# Patient Record
Sex: Female | Born: 1965
Health system: Southern US, Community
[De-identification: ages and names within clinical notes are randomized; demographics above are authoritative.]

## PROBLEM LIST (undated history)

## (undated) DIAGNOSIS — K2950 Unspecified chronic gastritis without bleeding: Secondary | ICD-10-CM

## (undated) DIAGNOSIS — F419 Anxiety disorder, unspecified: Secondary | ICD-10-CM

## (undated) DIAGNOSIS — O009 Unspecified ectopic pregnancy without intrauterine pregnancy: Secondary | ICD-10-CM

## (undated) DIAGNOSIS — R0600 Dyspnea, unspecified: Secondary | ICD-10-CM

## (undated) DIAGNOSIS — K219 Gastro-esophageal reflux disease without esophagitis: Secondary | ICD-10-CM

## (undated) DIAGNOSIS — K759 Inflammatory liver disease, unspecified: Secondary | ICD-10-CM

## (undated) DIAGNOSIS — J449 Chronic obstructive pulmonary disease, unspecified: Secondary | ICD-10-CM

## (undated) DIAGNOSIS — J189 Pneumonia, unspecified organism: Secondary | ICD-10-CM

## (undated) DIAGNOSIS — C801 Malignant (primary) neoplasm, unspecified: Secondary | ICD-10-CM

## (undated) DIAGNOSIS — K8020 Calculus of gallbladder without cholecystitis without obstruction: Secondary | ICD-10-CM

## (undated) HISTORY — DX: Anxiety disorder, unspecified: F41.9

## (undated) HISTORY — DX: Calculus of gallbladder without cholecystitis without obstruction: K80.20

## (undated) HISTORY — PX: MOUTH SURGERY: SHX715

## (undated) HISTORY — PX: DIAGNOSTIC LAPAROSCOPY: SUR761

## (undated) HISTORY — PX: LAPAROSCOPIC UNILATERAL SALPINGECTOMY: SHX5934

---

## 2009-07-10 DIAGNOSIS — K219 Gastro-esophageal reflux disease without esophagitis: Secondary | ICD-10-CM | POA: Insufficient documentation

## 2009-07-16 DIAGNOSIS — B1920 Unspecified viral hepatitis C without hepatic coma: Secondary | ICD-10-CM | POA: Insufficient documentation

## 2010-11-12 ENCOUNTER — Emergency Department: Payer: Self-pay | Admitting: Emergency Medicine

## 2011-01-08 ENCOUNTER — Ambulatory Visit: Payer: Self-pay

## 2012-01-29 DIAGNOSIS — K739 Chronic hepatitis, unspecified: Secondary | ICD-10-CM | POA: Insufficient documentation

## 2012-06-23 LAB — HM PAP SMEAR: HM Pap smear: NEGATIVE

## 2012-07-12 LAB — URINALYSIS, COMPLETE
Leukocyte Esterase: NEGATIVE
Protein: NEGATIVE
RBC,UR: 1 /HPF (ref 0–5)
Specific Gravity: 1.011 (ref 1.003–1.030)
Squamous Epithelial: 5
WBC UR: 1 /HPF (ref 0–5)

## 2012-07-12 LAB — COMPREHENSIVE METABOLIC PANEL
BUN: 11 mg/dL (ref 7–18)
Calcium, Total: 8.3 mg/dL — ABNORMAL LOW (ref 8.5–10.1)
Chloride: 103 mmol/L (ref 98–107)
EGFR (African American): >60
EGFR (Non-African Amer.): >60
Glucose: 86 mg/dL (ref 65–99)
Osmolality: 269 (ref 275–301)
Potassium: 3.8 mmol/L (ref 3.5–5.1)
Sodium: 135 mmol/L — ABNORMAL LOW (ref 136–145)
Total Protein: 7.6 g/dL (ref 6.4–8.2)

## 2012-07-12 LAB — CBC WITH DIFFERENTIAL/PLATELET
Basophil #: 0 10*3/uL (ref 0.0–0.1)
Basophil %: 0.5 %
HCT: 44.3 % (ref 35.0–47.0)
HGB: 15.1 g/dL (ref 12.0–16.0)
MCH: 30.5 pg (ref 26.0–34.0)
MCV: 89 fL (ref 80–100)
Monocyte #: 0.5 x10 3/mm (ref 0.2–0.9)
Neutrophil #: 3.2 10*3/uL (ref 1.4–6.5)
Neutrophil %: 61.2 %
Platelet: 149 10*3/uL — ABNORMAL LOW (ref 150–440)
RDW: 12.8 % (ref 11.5–14.5)
WBC: 5.3 10*3/uL (ref 3.6–11.0)

## 2012-07-12 LAB — PRO B NATRIURETIC PEPTIDE: B-Type Natriuretic Peptide: 57 pg/mL (ref 0–125)

## 2012-07-12 LAB — RAPID INFLUENZA A&B ANTIGENS

## 2012-07-13 ENCOUNTER — Inpatient Hospital Stay: Payer: Self-pay | Admitting: Specialist

## 2012-07-14 LAB — BASIC METABOLIC PANEL
Chloride: 111 mmol/L — ABNORMAL HIGH (ref 98–107)
Co2: 26 mmol/L (ref 21–32)
Creatinine: 0.69 mg/dL (ref 0.60–1.30)
EGFR (Non-African Amer.): >60
Glucose: 101 mg/dL — ABNORMAL HIGH (ref 65–99)
Sodium: 143 mmol/L (ref 136–145)

## 2012-07-14 LAB — CBC WITH DIFFERENTIAL/PLATELET
Eosinophil #: 0 10*3/uL (ref 0.0–0.7)
MCH: 30.6 pg (ref 26.0–34.0)
MCHC: 33.8 g/dL (ref 32.0–36.0)
MCV: 91 fL (ref 80–100)
Neutrophil #: 1.9 10*3/uL (ref 1.4–6.5)
Neutrophil %: 36.5 %
RBC: 4.58 10*6/uL (ref 3.80–5.20)
RDW: 12.8 % (ref 11.5–14.5)

## 2012-07-18 LAB — CULTURE, BLOOD (SINGLE)

## 2012-10-08 ENCOUNTER — Ambulatory Visit: Payer: Self-pay

## 2013-05-18 DIAGNOSIS — J4489 Other specified chronic obstructive pulmonary disease: Secondary | ICD-10-CM | POA: Insufficient documentation

## 2013-06-17 ENCOUNTER — Ambulatory Visit: Payer: Self-pay | Admitting: Family Medicine

## 2013-12-16 DIAGNOSIS — Z720 Tobacco use: Secondary | ICD-10-CM | POA: Insufficient documentation

## 2013-12-16 DIAGNOSIS — J449 Chronic obstructive pulmonary disease, unspecified: Secondary | ICD-10-CM | POA: Insufficient documentation

## 2013-12-16 DIAGNOSIS — E669 Obesity, unspecified: Secondary | ICD-10-CM | POA: Insufficient documentation

## 2014-07-11 DIAGNOSIS — J45909 Unspecified asthma, uncomplicated: Secondary | ICD-10-CM | POA: Insufficient documentation

## 2014-09-16 DIAGNOSIS — M545 Low back pain, unspecified: Secondary | ICD-10-CM | POA: Insufficient documentation

## 2014-09-28 ENCOUNTER — Emergency Department
Admit: 2014-09-28 | Disposition: A | Discharge status: Home / Self Care | Payer: Self-pay | Admitting: Emergency Medicine

## 2014-09-29 DIAGNOSIS — M5431 Sciatica, right side: Secondary | ICD-10-CM | POA: Insufficient documentation

## 2014-09-30 NOTE — H&P (Signed)
PATIENT NAME:  Molly Brandt, Molly Brandt MR#:  [id number] DATE OF BIRTH:  May 19, 1966  DATE OF ADMISSION:  07/13/2012  REFERRING PHYSICIAN:  Dr. Cinda Quest.  PRIMARY CARE PHYSICIAN:  [facility]  CHIEF COMPLAINT: Cough, fever, productive of  sputum.   HISTORY OF PRESENT ILLNESS: This is a 49 year old female with significant past medical history of gastroesophageal reflux disease, asthma, who presents with complaint of shortness of breath, cough and fever. The patient reports her symptoms started over last week, where she started to complain of cough that initially was nonproductive and then she started to have fever, the cough became productive with white the color of her sputum, as well was accompanied by earache in the right ear with fevers, chills, headache, back pain and shortness of breath.  As well,  she was feeling muscle aches all over, which prompted her to come to the ED.  In the ED, the patient had fever of 101.6, was hypoxic, saturating 91% on room air. The patient's chest x-ray was done and did not show any conclusive opacity, but did show some diffuse interstitial pneumonitis versus edema. The patient had blood cultures sent and was started on Rocephin and Zithromax. The patient did not have any leukocytosis.  The hospitalist service was requested to admit the patient due to her hypoxia and fever. The patient denies any chest pain, any dysuria, polyuria, coffee-ground emesis, diarrhea, constipation, abdominal pain, loss of consciousness or lightheadedness.   PAST MEDICAL HISTORY: 1.  Hepatitis C.  2.  Gastroesophageal reflux disease.  3.  Asthma.   PAST SURGICAL HISTORY:  1.  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  2.  Tonsillectomy.  3.  Laparoscopic salpingo-oophorectomy for ectopic pregnancy.   ALLERGIES: No known drug allergies.   HOME MEDICATIONS:  Omeprazole 40 mg oral daily.   SOCIAL HISTORY: The patient reports she is smoking less than 1 pack per day. She had decreased  dramatically over the last 2 days. Denies any alcohol or illicit drug use.   FAMILY HISTORY: Denies any family history of diabetes or hypertension.   REVIEW OF SYSTEMS: GENERAL: Complaints of fever, chills, fatigue, generalized weakness and body ache.  EYES: Denies blurry vision, double vision, inflammation, glaucoma.  ENT: Reports right earache. He denies tinnitus, hearing loss, epistaxis discharge, snoring.  RESPIRATORY: Complains of cough with productive sputum, yellow in color and dyspnea. Denies any history of COPD or hemoptysis.  CARDIOVASCULAR: Denies chest pain, edema, arrhythmia, palpitations or syncope.  GASTROINTESTINAL: Denies nausea, vomiting, diarrhea, abdominal pain, hematemesis, rectal bleed.  GENITOURINARY: Denies dysuria, hematuria or renal colic.  ENDOCRINE:  Denies polyuria, polydipsia, heat or cold intolerance.  HEMATOLOGY: Denies anemia, easy bruising or bleeding diathesis.  INTEGUMENTARY: Denies acne, rash or skin lesions.  MUSCULOSKELETAL: Complains of generalized body ache, Denies any redness, cramps, arthritis or gout.  NEUROLOGIC: Denies CVA, TIA, seizures, dementia, weakness, numbness, tremors or vertigo.  PSYCHIATRIC: Denies anxiety, schizophrenia, nervousness, insomnia, substance or alcohol abuse.   PHYSICAL EXAMINATION: VITAL SIGNS: Temperature 99.7, T-max 101.6, pulse 79, respiratory rate 18, blood pressure 110/61, saturating 94% on oxygen.  GENERAL: Well-nourished female who is comfortable in bed in no apparent distress.  HEENT: Head atraumatic, normocephalic. Pupils equal, reactive to light. Pink conjunctivae. Anicteric sclerae. Moist oral mucosa.  NECK: Supple. No thyromegaly. No JVD.  CHEST: Had good air entry bilaterally. No wheezing, rales, rhonchi.  CARDIOVASCULAR: S1, S2 heard. No rubs, murmur or gallops.  ABDOMEN: Soft, nontender, nondistended. Bowel sounds present  EXTREMITIES: No edema. No clubbing.  No cyanosis.   PSYCHIATRIC: Awake, alert x 3  communicative, pleasant. Intact judgment and insight.  NEUROLOGIC: No gross intact. Motor 5/5. No focal deficits.  SKIN: Normal skin turgor. Warm and dry.   PERTINENT LABORATORY AND DIAGNOSTIC DATA:  Glucose 86, BNP 57, BUN 11, creatinine 0.71, sodium 135, potassium 3.8, chloride 103, CO2 23, ALT 62, AST 62, alkaline phosphatase is 88.  White blood cell 5.3, hemoglobin 15.1, hematocrit 44.3, platelets 149. Influenza negative. Urinalysis negative nitrite.  Chest x-ray: Bilateral diffuse interstitial thickening representing interstitial edema versus interstitial pneumonitis secondary to infectious or inflammatory etiology.  No focal parenchymal opacity.   ASSESSMENT AND PLAN: 1.  Fever, productive sputum, shortness of breath. This is most likely due to acute bronchitis as there is no concrete evidence of opacity on the chest x-ray, but as well, it might be too soon for the chest x-ray to show opacity for pneumonia. We will start the patient on intravenous Levaquin, we will follow on the blood cultures and we will repeat the chest x-ray in 24 hours to see if there is any opacity that develops.  We will continue the same treatment as well, for  pneumonia if found on her next chest x-ray.  2.  History of asthma. The patient has no wheezing. We will continue with oxygen as needed and will have her on p.r.n. albuterol.  3.  Tobacco abuse. The patient was counseled and she is requesting nicotine patches.  4.  Deep vein thrombosis prophylaxis. Subcutaneous heparin.  5.  Gastroesophageal reflux disease. Continue with Protonix.   CODE STATUS: Full code.   Total time spent on admission and patient care: 50 minutes.    ____________________________ Albertine Patricia, MD dse:ct D: 07/13/2012 01:48:06 ET T: 07/13/2012 09:34:50 ET JOB#: [id number]  cc: Albertine Patricia, MD, <Dictator> French Kendra Graciela Husbands MD ELECTRONICALLY SIGNED 07/17/2012 5:09

## 2014-09-30 NOTE — Discharge Summary (Signed)
PATIENT NAME:  Molly Brandt, Molly Brandt MR#:  [id number] DATE OF BIRTH:  [birth year]  DATE OF ADMISSION:  07/13/2012 DATE OF DISCHARGE:  07/15/2012  For a detailed note, please take a look at the history and physical on admission by Dr. Waldron Labs.    DIAGNOSES AT DISCHARGE: As follows: Acute bronchitis/pneumonia. Chronic obstructive pulmonary disease not requiring home oxygen. Gastroesophageal reflux disease. Fever of unknown origin, now resolved.   DIET: The patient is being discharged on a regular diet.   ACTIVITY: As tolerated.   FOLLOWUP: At the [facility] in the next 1 to 2 weeks.   DISCHARGE MEDICATIONS: Omeprazole 40 mg daily, prednisone taper starting at 50 mg down to 10 mg over the next 5 days, Levaquin 5 mg daily x7 days, Spiriva 1 puff daily and Advair 250/50 one puff b.i.d.   PERTINENT STUDIES DONE DURING THE HOSPITAL COURSE: As follows: A chest x-ray done on admission showing bilateral diffuse interstitial thickening, likely representing interstitial pneumonia versus interstitial pneumonitis. Blood cultures essentially negative. Influenza A and B antigens negative.   BRIEF HOSPITAL COURSE: This is a 49 year old female with medical problems as mentioned above, presented to the hospital with fever, cough, congestion.  1. Fever of unknown origin. The exact source of the fever is still unclear but suspected to be secondary to acute bronchitis/pneumonia. The patient was admitted to the hospital, started empirically on IV Levaquin. Since then, her fever curve has improved and now resolved. Her chest x-ray showed b/l diffuse interstitial opacities consistent with interstitial edema versus interstitial pneumonitis but no evidence of pneumonia. Her urinalysis was negative. Since she has improved on Levaquin, she is being discharged on p.o. Levaquin over the next 5 days.  2. GERD. The patient was maintained on her Prilosec. She will resume that.  3. COPD with mild COPD exacerbation. She likely had  a mild COPD exacerbation secondary to acute bronchitis and pneumonia. The patient has a long-time history of tobacco abuse. She is not on any inhalers. She was ambulated on room air, desatted to about 81% to 82% on room air. She was strongly advised to quit smoking. I did discharge her on some Advair, Spiriva and prednisone taper and empiric Levaquin as stated. She was also discharged on home oxygen and will have close followup at the [facility] as an outpatient.   The patient was in agreement with this plan.   CODE STATUS: The patient is a full code.   TIME SPENT: 40 minutes.    ____________________________ Belia Heman. Verdell Carmine, MD vjs:gb D: 07/15/2012 16:20:33 ET T: 07/16/2012 03:50:00 ET JOB#: [id number]  cc: Belia Heman. Verdell Carmine, MD, <Dictator> [facility] Henreitta Leber MD ELECTRONICALLY SIGNED 07/22/2012 17:36

## 2014-12-15 DIAGNOSIS — K219 Gastro-esophageal reflux disease without esophagitis: Secondary | ICD-10-CM | POA: Insufficient documentation

## 2015-01-09 ENCOUNTER — Encounter: Payer: Self-pay | Admitting: *Deleted

## 2015-01-10 ENCOUNTER — Ambulatory Visit: Admission: RE | Admit: 2015-01-10 | Payer: Medicaid Other | Source: Ambulatory Visit | Admitting: Gastroenterology

## 2015-01-10 ENCOUNTER — Encounter: Admission: RE | Payer: Self-pay | Source: Ambulatory Visit

## 2015-01-10 HISTORY — DX: Chronic obstructive pulmonary disease, unspecified: J44.9

## 2015-01-10 HISTORY — DX: Inflammatory liver disease, unspecified: K75.9

## 2015-01-10 SURGERY — COLONOSCOPY WITH PROPOFOL
Anesthesia: General

## 2015-02-27 ENCOUNTER — Encounter: Admission: RE | Payer: Self-pay | Source: Ambulatory Visit

## 2015-02-27 ENCOUNTER — Ambulatory Visit: Admission: RE | Admit: 2015-02-27 | Payer: Medicaid Other | Source: Ambulatory Visit | Admitting: Gastroenterology

## 2015-02-27 SURGERY — ESOPHAGOGASTRODUODENOSCOPY (EGD) WITH PROPOFOL
Anesthesia: General

## 2015-04-07 ENCOUNTER — Encounter: Payer: Self-pay | Admitting: *Deleted

## 2015-04-10 ENCOUNTER — Encounter: Payer: Self-pay | Admitting: Anesthesiology

## 2015-04-10 ENCOUNTER — Ambulatory Visit
Admission: RE | Admit: 2015-04-10 | Discharge: 2015-04-10 | Disposition: A | Discharge status: Home / Self Care | Payer: Medicaid Other | Source: Ambulatory Visit | Attending: Gastroenterology | Admitting: Gastroenterology

## 2015-04-10 ENCOUNTER — Encounter
Admission: RE | Disposition: A | Discharge status: Home / Self Care | Payer: Self-pay | Source: Ambulatory Visit | Attending: Gastroenterology

## 2015-04-10 ENCOUNTER — Encounter: Payer: Self-pay | Admitting: *Deleted

## 2015-04-10 DIAGNOSIS — K219 Gastro-esophageal reflux disease without esophagitis: Secondary | ICD-10-CM | POA: Diagnosis not present

## 2015-04-10 DIAGNOSIS — Z9981 Dependence on supplemental oxygen: Secondary | ICD-10-CM | POA: Diagnosis not present

## 2015-04-10 DIAGNOSIS — Z79899 Other long term (current) drug therapy: Secondary | ICD-10-CM | POA: Insufficient documentation

## 2015-04-10 DIAGNOSIS — B1920 Unspecified viral hepatitis C without hepatic coma: Secondary | ICD-10-CM | POA: Insufficient documentation

## 2015-04-10 DIAGNOSIS — K625 Hemorrhage of anus and rectum: Secondary | ICD-10-CM | POA: Insufficient documentation

## 2015-04-10 DIAGNOSIS — J449 Chronic obstructive pulmonary disease, unspecified: Secondary | ICD-10-CM | POA: Insufficient documentation

## 2015-04-10 DIAGNOSIS — Z539 Procedure and treatment not carried out, unspecified reason: Secondary | ICD-10-CM | POA: Diagnosis not present

## 2015-04-10 LAB — PROTIME-INR
INR: 0.85
Prothrombin Time: 11.8 seconds (ref 11.4–15.0)

## 2015-04-10 LAB — PLATELET COUNT: Platelets: 272 10*3/uL (ref 150–440)

## 2015-04-10 SURGERY — COLONOSCOPY WITH PROPOFOL
Anesthesia: General

## 2015-04-10 MED ORDER — SODIUM CHLORIDE 0.9 % IV SOLN
INTRAVENOUS | Status: DC
  Administered 2015-04-10: 16:00:00 via INTRAVENOUS

## 2015-04-10 MED ORDER — SODIUM CHLORIDE 0.9 % IV SOLN: INTRAVENOUS | Status: DC

## 2015-04-10 NOTE — H&P (Addendum)
Outpatient short stay form Pre-procedure 04/10/2015 4:53 PM Lollie Sails MD  Primary Physician: Dr. Hetty Blend  Reason for visit:  EGD and colonoscopy  History of present illness:  Patient is a 49 year old female presenting for EGD and colonoscopy in regards to history of reflux refractive to current GI as well as some rectal bleeding. She's never had a EGD or colonoscopy in the past. She tolerated her prep well. She denies taking any aspirin or blood thinner products. She is however on for medications for her breathing as well as home oxygen.    Current facility-administered medications:  .  0.9 %  sodium chloride infusion, , Intravenous, Continuous, Lollie Sails, MD, Last Rate: 20 mL/hr at 04/10/15 1620 .  0.9 %  sodium chloride infusion, , Intravenous, Continuous, Lollie Sails, MD .  0.9 %  sodium chloride infusion, , Intravenous, Continuous, Lollie Sails, MD  Prescriptions prior to admission  Medication Sig Dispense Refill Last Dose  . albuterol (PROVENTIL HFA;VENTOLIN HFA) 108 (90 BASE) MCG/ACT inhaler Inhale 2 puffs into the lungs every 6 (six) hours as needed for wheezing or shortness of breath.   04/09/2015 at 0600  . Fluticasone-Salmeterol (ADVAIR) 250-50 MCG/DOSE AEPB Inhale 1 puff into the lungs 2 (two) times daily.   04/09/2015 at Unknown time  . mometasone (NASONEX) 50 MCG/ACT nasal spray Place 2 sprays into the nose daily.   04/10/2015 at 0600  . omeprazole (PRILOSEC) 20 MG capsule Take 20 mg by mouth daily.   04/10/2015 at 0600  . tiotropium (SPIRIVA) 18 MCG inhalation capsule Place 18 mcg into inhaler and inhale daily.   Past Week at Unknown time  . cyclobenzaprine (FLEXERIL) 10 MG tablet Take 10 mg by mouth 3 (three) times daily as needed for muscle spasms.   Not Taking at Unknown time     Allergies  Allergen Reactions  . Naproxen   . Tramadol      Past Medical History  Diagnosis Date  . COPD (chronic obstructive pulmonary disease) ([place])    . Hepatitis     Hepatitis C    Review of systems:      Physical Exam    Heart and lungs: Regular rate and rhythm without rub or gallop, lungs are bilaterally clear    HEENT: Normocephalic atraumatic eyes are anicteric    Other:     Pertinant exam for procedure: Soft nontender nondistended bowel sounds positive normoactive    Planned proceedures: EGD and colonoscopy with indicated procedures I have discussed the risks benefits and complications of procedures to include not limited to bleeding, infection, perforation and the risk of sedation and the patient wishes to proceed.    Lollie Sails, MD Gastroenterology 04/10/2015  4:53 PM     Anesthesia was not able to provide services this evening do this case on this ASA 3 patient with chronic lung disease. We'll arrange for her to return tomorrow. Clear liquids overnight and one half bottle of magnesium citrate this evening. Discussed with patient she appreciates the reason for the delay and agrees.

## 2015-04-11 ENCOUNTER — Encounter: Admission: RE | Payer: Self-pay | Source: Ambulatory Visit

## 2015-04-11 ENCOUNTER — Encounter
Admission: RE | Disposition: A | Discharge status: Home / Self Care | Payer: Self-pay | Source: Ambulatory Visit | Attending: Gastroenterology

## 2015-04-11 ENCOUNTER — Ambulatory Visit: Admission: RE | Admit: 2015-04-11 | Payer: Medicaid Other | Source: Ambulatory Visit | Admitting: Gastroenterology

## 2015-04-11 ENCOUNTER — Ambulatory Visit
Admission: RE | Admit: 2015-04-11 | Discharge: 2015-04-11 | Disposition: A | Discharge status: Home / Self Care | Payer: Medicaid Other | Source: Ambulatory Visit | Attending: Gastroenterology | Admitting: Gastroenterology

## 2015-04-11 ENCOUNTER — Encounter: Payer: Self-pay | Admitting: *Deleted

## 2015-04-11 ENCOUNTER — Ambulatory Visit: Payer: Medicaid Other | Admitting: Anesthesiology

## 2015-04-11 DIAGNOSIS — K2950 Unspecified chronic gastritis without bleeding: Secondary | ICD-10-CM

## 2015-04-11 DIAGNOSIS — K2930 Chronic superficial gastritis without bleeding: Secondary | ICD-10-CM | POA: Diagnosis not present

## 2015-04-11 DIAGNOSIS — B1920 Unspecified viral hepatitis C without hepatic coma: Secondary | ICD-10-CM | POA: Insufficient documentation

## 2015-04-11 DIAGNOSIS — K625 Hemorrhage of anus and rectum: Secondary | ICD-10-CM | POA: Diagnosis not present

## 2015-04-11 DIAGNOSIS — Z87891 Personal history of nicotine dependence: Secondary | ICD-10-CM | POA: Diagnosis not present

## 2015-04-11 DIAGNOSIS — Z886 Allergy status to analgesic agent status: Secondary | ICD-10-CM | POA: Diagnosis not present

## 2015-04-11 DIAGNOSIS — K210 Gastro-esophageal reflux disease with esophagitis: Secondary | ICD-10-CM | POA: Diagnosis not present

## 2015-04-11 DIAGNOSIS — J449 Chronic obstructive pulmonary disease, unspecified: Secondary | ICD-10-CM | POA: Diagnosis not present

## 2015-04-11 DIAGNOSIS — K6289 Other specified diseases of anus and rectum: Secondary | ICD-10-CM | POA: Insufficient documentation

## 2015-04-11 DIAGNOSIS — Z885 Allergy status to narcotic agent status: Secondary | ICD-10-CM | POA: Diagnosis not present

## 2015-04-11 DIAGNOSIS — R1011 Right upper quadrant pain: Secondary | ICD-10-CM | POA: Diagnosis present

## 2015-04-11 HISTORY — DX: Unspecified chronic gastritis without bleeding: K29.50

## 2015-04-11 HISTORY — PX: COLONOSCOPY WITH PROPOFOL: SHX5780

## 2015-04-11 HISTORY — PX: ESOPHAGOGASTRODUODENOSCOPY (EGD) WITH PROPOFOL: SHX5813

## 2015-04-11 SURGERY — ESOPHAGOGASTRODUODENOSCOPY (EGD) WITH PROPOFOL
Anesthesia: General

## 2015-04-11 SURGERY — COLONOSCOPY WITH PROPOFOL
Anesthesia: General

## 2015-04-11 MED ORDER — FENTANYL CITRATE (PF) 100 MCG/2ML IJ SOLN
INTRAMUSCULAR | Status: DC | PRN
  Administered 2015-04-11: 50 ug via INTRAVENOUS

## 2015-04-11 MED ORDER — HYDRALAZINE HCL 20 MG/ML IJ SOLN
INTRAMUSCULAR | Status: DC | PRN
  Administered 2015-04-11: 10 mg via INTRAVENOUS

## 2015-04-11 MED ORDER — SODIUM CHLORIDE 0.9 % IV SOLN
INTRAVENOUS | Status: DC
  Administered 2015-04-11: 08:00:00 via INTRAVENOUS

## 2015-04-11 MED ORDER — LIDOCAINE HCL (CARDIAC) 20 MG/ML IV SOLN
INTRAVENOUS | Status: DC | PRN
  Administered 2015-04-11: 60 mg via INTRAVENOUS

## 2015-04-11 MED ORDER — PROPOFOL 500 MG/50ML IV EMUL
INTRAVENOUS | Status: DC | PRN
  Administered 2015-04-11: 140 ug/kg/min via INTRAVENOUS

## 2015-04-11 MED ORDER — MIDAZOLAM HCL 2 MG/2ML IJ SOLN
INTRAMUSCULAR | Status: DC | PRN
  Administered 2015-04-11: 1 mg via INTRAVENOUS

## 2015-04-11 MED ORDER — SODIUM CHLORIDE 0.9 % IV SOLN
INTRAVENOUS | Status: DC
  Administered 2015-04-11: 1000 mL via INTRAVENOUS

## 2015-04-11 MED ORDER — IPRATROPIUM-ALBUTEROL 0.5-2.5 (3) MG/3ML IN SOLN
RESPIRATORY_TRACT | Status: AC
  Administered 2015-04-11: 3 mL
  Filled 2015-04-11: qty 3

## 2015-04-11 NOTE — Anesthesia Preprocedure Evaluation (Signed)
Anesthesia Evaluation  Patient identified by MRN, date of birth, ID band Patient awake    Reviewed: Allergy & Precautions, H&P , NPO status , Patient's Chart, lab work & pertinent test results  History of Anesthesia Complications Negative for: history of anesthetic complications  Airway Mallampati: III  TM Distance: >3 FB Neck ROM: full    Dental  (+) Poor Dentition, Chipped, Missing   Pulmonary neg shortness of breath, COPD, former smoker,    Pulmonary exam normal breath sounds clear to auscultation       Cardiovascular Exercise Tolerance: Good (-) angina(-) Past MI and (-) DOE negative cardio ROS Normal cardiovascular exam Rhythm:regular Rate:Normal     Neuro/Psych negative neurological ROS  negative psych ROS   GI/Hepatic negative GI ROS, (+) Hepatitis -, C  Endo/Other  negative endocrine ROS  Renal/GU negative Renal ROS  negative genitourinary   Musculoskeletal   Abdominal   Peds  Hematology negative hematology ROS (+)   Anesthesia Other Findings Past Medical History:   COPD (chronic obstructive pulmonary disease) (*              Hepatitis                                                      Comment:Hepatitis C  Past Surgical History:   LAPAROSCOPIC UNILATERAL SALPINGECTOMY           Left              DIAGNOSTIC LAPAROSCOPY                                       BMI    Body Mass Index   34.74 kg/m 2      Reproductive/Obstetrics negative OB ROS                             Anesthesia Physical Anesthesia Plan  ASA: III  Anesthesia Plan: General   Post-op Pain Management:    Induction:   Airway Management Planned:   Additional Equipment:   Intra-op Plan:   Post-operative Plan:   Informed Consent: I have reviewed the patients History and Physical, chart, labs and discussed the procedure including the risks, benefits and alternatives for the proposed anesthesia with  the patient or authorized representative who has indicated his/her understanding and acceptance.   Dental Advisory Given  Plan Discussed with: Anesthesiologist, CRNA and Surgeon  Anesthesia Plan Comments:         Anesthesia Quick Evaluation

## 2015-04-11 NOTE — Op Note (Signed)
[facility] Gastroenterology Patient Name: Molly Brandt Procedure Date: 04/11/2015 8:12 AM MRN: [id number] Account #: [id number] Date of Birth: [birth year] Admit Type: Outpatient Age: 49 Room: [facility] ENDO ROOM 3 Gender: Female Note Status: Finalized Procedure:         Upper GI endoscopy Indications:       Abdominal pain in the right upper quadrant, Dyspepsia Providers:         Lollie Sails, MD Referring MD:      No Local Md, MD (Referring MD) Medicines:         Monitored Anesthesia Care Complications:     No immediate complications. Procedure:         Pre-Anesthesia Assessment:                    - ASA Grade Assessment: III - A patient with severe                     systemic disease.                    After obtaining informed consent, the endoscope was passed                     under direct vision. Throughout the procedure, the                     patient's blood pressure, pulse, and oxygen saturations                     were monitored continuously. The Endoscope was introduced                     through the mouth, and advanced to the third part of                     duodenum. The upper GI endoscopy was accomplished without                     difficulty. The patient tolerated the procedure well. Findings:      The Z-line was irregular. Biopsies were taken with a cold forceps for       histology.      The exam of the esophagus was otherwise normal.      Diffuse and patchy mild inflammation characterized by erythema was found       in the gastric body. Biopsies were taken with a cold forceps for       histology.      Patchy mild inflammation characterized by erosions and erythema was       found in the gastric antrum. Biopsies were taken with a cold forceps for       histology. Biopsies were taken with a cold forceps for Helicobacter       pylori testing.      The cardia and gastric fundus were normal on retroflexion.      The examined duodenum was  normal. Impression:        - Z-line irregular. Biopsied.                    - Gastritis. Biopsied.                    - Erosive gastritis. Biopsied.                    -  Normal examined duodenum. Recommendation:    - Use Prilosec (omeprazole) 40 mg PO daily daily.                    - Await pathology results.                    - Return to GI clinic in 1 month.                    - for continued sylmptoms,. recommend evaluation for                     biliay dyskinesia and gallbladder disease Procedure Code(s): --- Professional ---                    [phone], Esophagogastroduodenoscopy, flexible, transoral;                     with biopsy, single or multiple Diagnosis Code(s): --- Professional ---                    530.89, Other specified disorders of esophagus                    535.50, Unspecified gastritis and gastroduodenitis,                     without mention of hemorrhage                    535.40, Other specified gastritis, without mention of                     hemorrhage                    789.01, Abdominal pain, right upper quadrant                    536.8, Dyspepsia and other specified disorders of function                     of stomach CPT copyright 2014 American Medical Association. All rights reserved. The codes documented in this report are preliminary and upon coder review may  be revised to meet current compliance requirements. Lollie Sails, MD 04/11/2015 8:28:47 AM This report has been signed electronically. Number of Addenda: 0 Note Initiated On: 04/11/2015 8:12 AM      [facility]

## 2015-04-11 NOTE — H&P (Addendum)
Outpatient short stay form Pre-procedure 04/11/2015 8:03 AM Lollie Sails MD  Primary Physician: Dr. Hetty Blend  Reason for visit:  EGD and colonoscopy  History of present illness:  Patient is a 49 year old female presenting today with complaint of this can acid reflux not responsive to omeprazole 20 mg daily. He also has right upper quadrant pain. This does not seem to radiate anywhere. It is associated with episodes of nausea and vomiting. She has not had a gallbladder surgery in the past. She is returning from yesterday when we were unable to do the procedure due to loss of anesthesia coverage.      Current facility-administered medications:  .  0.9 %  sodium chloride infusion, , Intravenous, Continuous, Lollie Sails, MD  Prescriptions prior to admission  Medication Sig Dispense Refill Last Dose  . albuterol (PROVENTIL HFA;VENTOLIN HFA) 108 (90 BASE) MCG/ACT inhaler Inhale 2 puffs into the lungs every 6 (six) hours as needed for wheezing or shortness of breath.   04/10/2015 at Unknown time  . cyclobenzaprine (FLEXERIL) 10 MG tablet Take 10 mg by mouth 3 (three) times daily as needed for muscle spasms.   04/10/2015 at Unknown time  . Fluticasone-Salmeterol (ADVAIR) 250-50 MCG/DOSE AEPB Inhale 1 puff into the lungs 2 (two) times daily.   04/10/2015 at Unknown time  . mometasone (NASONEX) 50 MCG/ACT nasal spray Place 2 sprays into the nose daily.   04/10/2015 at Unknown time  . omeprazole (PRILOSEC) 20 MG capsule Take 20 mg by mouth daily.   04/10/2015 at Unknown time  . tiotropium (SPIRIVA) 18 MCG inhalation capsule Place 18 mcg into inhaler and inhale daily.   04/10/2015 at Unknown time     Allergies  Allergen Reactions  . Naproxen   . Tramadol      Past Medical History  Diagnosis Date  . COPD (chronic obstructive pulmonary disease) ([place])   . Hepatitis     Hepatitis C    Review of systems:      Physical Exam    Heart and lungs: Regular rate and rhythm  without rub or gallop, lungs are bilaterally clear    HEENT: Normocephalic atraumatic eyes are anicteric    Other:     Pertinant exam for procedure: Soft nontender nondistended bowel sounds positive normoactive    Planned proceedures: EGD and colonoscopy with indicated procedures I have discussed the risks benefits and complications of procedures to include not limited to bleeding, infection, perforation and the risk of sedation and the patient wishes to proceed.    Lollie Sails, MD Gastroenterology 04/11/2015  8:03 AM     I did a rectal examination this morning she stated she did not have any further bowel movements overnight. This had some semisoft material. We will proceed with the upper scope. We will check the rectum with the scope and see if the prep is adequate if not she will need to be rescheduled. This was discussed with the patient.

## 2015-04-11 NOTE — Anesthesia Postprocedure Evaluation (Signed)
  Anesthesia Post-op Note  Patient: Molly Brandt  Procedure(s) Performed: Procedure(s): COLONOSCOPY WITH PROPOFOL (N/A) ESOPHAGOGASTRODUODENOSCOPY (EGD) WITH PROPOFOL (N/A)  Anesthesia type:General  Patient location: PACU  Post pain: Pain level controlled  Post assessment: Post-op Vital signs reviewed, Patient's Cardiovascular Status Stable, Respiratory Function Stable, Patent Airway and No signs of Nausea or vomiting  Post vital signs: Reviewed and stable  Last Vitals:  Filed Vitals:   04/11/15 0909  BP: 134/98  Pulse: 85  Temp:   Resp: 17    Level of consciousness: awake, alert  and patient cooperative  Complications: No apparent anesthesia complications

## 2015-04-11 NOTE — Anesthesia Procedure Notes (Signed)
Performed by: COOK-MARTIN, Houa Ackert Pre-anesthesia Checklist: Patient identified, Emergency Drugs available, Suction available, Patient being monitored and Timeout performed Patient Re-evaluated:Patient Re-evaluated prior to inductionOxygen Delivery Method: Nasal cannula Preoxygenation: Pre-oxygenation with 100% oxygen Intubation Type: IV induction Airway Equipment and Method: Bite block Placement Confirmation: positive ETCO2 and CO2 detector     

## 2015-04-11 NOTE — Op Note (Signed)
[facility] Gastroenterology Patient Name: Molly Brandt Procedure Date: 04/11/2015 8:12 AM MRN: [id number] Account #: [id number] Date of Birth: February 19, 1966 Admit Type: Outpatient Age: 49 Room: [facility] ENDO ROOM 3 Gender: Female Note Status: Finalized Procedure:         Colonoscopy Indications:       Rectal bleeding, Rectal pain Providers:         Lollie Sails, MD Referring MD:      No Local Md, MD (Referring MD) Medicines:         Monitored Anesthesia Care Complications:     No immediate complications. Procedure:         Pre-Anesthesia Assessment:                    - ASA Grade Assessment: III - A patient with severe                     systemic disease.                    After obtaining informed consent, the colonoscope was                     passed under direct vision. Throughout the procedure, the                     patient's blood pressure, pulse, and oxygen saturations                     were monitored continuously. The procedure was aborted.                     The colonscope was not inserted. Medications were given. Findings:      When patient was turned, she was noted to have stool leakage, this being       some solid as well as liquid stool. Scope not inserted. Impression:        - No specimens collected. Recommendation:    - For future colonoscopy the patient will require an                     extended preparation. If there are any questions, please                     contact the gastroenterologist. Diagnosis Code(s): --- Professional ---                    569.3, Hemorrhage of rectum and anus                    569.42, Anal or rectal pain Lollie Sails, MD 04/11/2015 8:33:20 AM This report has been signed electronically. Number of Addenda: 0 Note Initiated On: 04/11/2015 8:12 AM      [facility]

## 2015-04-11 NOTE — Transfer of Care (Signed)
Immediate Anesthesia Transfer of Care Note  Patient: Molly Brandt  Procedure(s) Performed: Procedure(s): COLONOSCOPY WITH PROPOFOL (N/A) ESOPHAGOGASTRODUODENOSCOPY (EGD) WITH PROPOFOL (N/A)  Patient Location: PACU  Anesthesia Type:General  Level of Consciousness: awake and sedated  Airway & Oxygen Therapy: Patient connected to face mask oxygen  Post-op Assessment: Report given to RN and Post -op Vital signs reviewed and stable  Post vital signs: Reviewed and stable  Last Vitals:  Filed Vitals:   04/11/15 0746  BP: 172/111  Pulse: 73  Temp: 36.7 C  Resp: 18    Complications: No apparent anesthesia complications

## 2015-04-12 ENCOUNTER — Encounter: Payer: Self-pay | Admitting: Gastroenterology

## 2015-04-12 LAB — SURGICAL PATHOLOGY

## 2015-06-01 ENCOUNTER — Other Ambulatory Visit: Payer: Self-pay | Admitting: Gastroenterology

## 2015-06-01 DIAGNOSIS — R1084 Generalized abdominal pain: Secondary | ICD-10-CM

## 2015-06-19 ENCOUNTER — Ambulatory Visit: Admission: RE | Admit: 2015-06-19 | Payer: Medicaid Other | Source: Ambulatory Visit

## 2015-06-23 ENCOUNTER — Ambulatory Visit: Payer: Medicaid Other

## 2015-06-23 ENCOUNTER — Ambulatory Visit: Admission: RE | Admit: 2015-06-23 | Payer: Medicaid Other | Source: Ambulatory Visit

## 2015-07-05 ENCOUNTER — Encounter: Admission: RE | Admit: 2015-07-05 | Payer: Medicaid Other | Source: Ambulatory Visit

## 2015-07-05 ENCOUNTER — Ambulatory Visit: Payer: Medicaid Other

## 2015-07-20 ENCOUNTER — Ambulatory Visit
Admission: RE | Admit: 2015-07-20 | Discharge: 2015-07-20 | Disposition: A | Discharge status: Home / Self Care | Payer: Medicaid Other | Source: Ambulatory Visit | Attending: Physical Medicine and Rehabilitation | Admitting: Physical Medicine and Rehabilitation

## 2015-07-20 ENCOUNTER — Other Ambulatory Visit: Payer: Self-pay | Admitting: Physical Medicine and Rehabilitation

## 2015-07-20 DIAGNOSIS — M5136 Other intervertebral disc degeneration, lumbar region: Secondary | ICD-10-CM

## 2015-07-20 DIAGNOSIS — M47816 Spondylosis without myelopathy or radiculopathy, lumbar region: Secondary | ICD-10-CM | POA: Insufficient documentation

## 2015-07-28 ENCOUNTER — Encounter: Payer: Self-pay | Admitting: *Deleted

## 2015-07-31 ENCOUNTER — Encounter: Admission: RE | Payer: Self-pay | Source: Ambulatory Visit

## 2015-07-31 ENCOUNTER — Ambulatory Visit: Admission: RE | Admit: 2015-07-31 | Payer: Medicaid Other | Source: Ambulatory Visit | Admitting: Gastroenterology

## 2015-07-31 HISTORY — DX: Unspecified chronic gastritis without bleeding: K29.50

## 2015-07-31 HISTORY — DX: Unspecified ectopic pregnancy without intrauterine pregnancy: O00.90

## 2015-07-31 SURGERY — COLONOSCOPY WITH PROPOFOL
Anesthesia: General

## 2015-08-03 ENCOUNTER — Ambulatory Visit: Admission: RE | Admit: 2015-08-03 | Payer: Medicaid Other | Source: Ambulatory Visit

## 2015-08-03 ENCOUNTER — Encounter: Admission: RE | Admit: 2015-08-03 | Payer: Medicaid Other | Source: Ambulatory Visit

## 2015-12-01 ENCOUNTER — Other Ambulatory Visit: Payer: Self-pay | Admitting: Thoracic Surgery

## 2015-12-01 DIAGNOSIS — J449 Chronic obstructive pulmonary disease, unspecified: Secondary | ICD-10-CM

## 2016-01-04 ENCOUNTER — Ambulatory Visit: Payer: Medicaid Other | Attending: Thoracic Surgery

## 2016-01-25 ENCOUNTER — Ambulatory Visit: Payer: Disability Insurance | Attending: Thoracic Surgery

## 2016-01-25 DIAGNOSIS — J449 Chronic obstructive pulmonary disease, unspecified: Secondary | ICD-10-CM | POA: Diagnosis not present

## 2016-01-25 MED ORDER — ALBUTEROL SULFATE (2.5 MG/3ML) 0.083% IN NEBU
2.5000 mg | INHALATION_SOLUTION | Freq: Once | RESPIRATORY_TRACT | Status: AC
  Administered 2016-01-25: 2.5 mg via RESPIRATORY_TRACT
  Filled 2016-01-25: qty 3

## 2016-08-07 DIAGNOSIS — Z62810 Personal history of physical and sexual abuse in childhood: Secondary | ICD-10-CM | POA: Insufficient documentation

## 2016-08-07 DIAGNOSIS — Q369 Cleft lip, unilateral: Secondary | ICD-10-CM | POA: Insufficient documentation

## 2016-08-07 DIAGNOSIS — F1911 Other psychoactive substance abuse, in remission: Secondary | ICD-10-CM | POA: Insufficient documentation

## 2017-02-20 DIAGNOSIS — R9431 Abnormal electrocardiogram [ECG] [EKG]: Secondary | ICD-10-CM | POA: Insufficient documentation

## 2017-03-05 ENCOUNTER — Other Ambulatory Visit: Payer: Self-pay | Admitting: Cardiology

## 2017-03-05 DIAGNOSIS — R0789 Other chest pain: Secondary | ICD-10-CM

## 2017-03-31 ENCOUNTER — Encounter: Admission: RE | Admit: 2017-03-31 | Payer: Medicaid Other | Source: Ambulatory Visit

## 2017-04-07 ENCOUNTER — Other Ambulatory Visit: Payer: Disability Insurance

## 2017-04-09 ENCOUNTER — Ambulatory Visit: Payer: Disability Insurance

## 2017-06-10 DIAGNOSIS — C801 Malignant (primary) neoplasm, unspecified: Secondary | ICD-10-CM

## 2017-06-10 HISTORY — DX: Malignant (primary) neoplasm, unspecified: C80.1

## 2017-08-11 DIAGNOSIS — Z85810 Personal history of malignant neoplasm of tongue: Secondary | ICD-10-CM | POA: Insufficient documentation

## 2017-08-26 ENCOUNTER — Encounter
Admission: RE | Admit: 2017-08-26 | Discharge: 2017-08-26 | Disposition: A | Discharge status: Home / Self Care | Payer: Medicaid Other | Source: Ambulatory Visit | Attending: Otolaryngology | Admitting: Otolaryngology

## 2017-08-26 ENCOUNTER — Other Ambulatory Visit: Payer: Self-pay

## 2017-08-26 HISTORY — DX: Dyspnea, unspecified: R06.00

## 2017-08-26 HISTORY — DX: Gastro-esophageal reflux disease without esophagitis: K21.9

## 2017-08-26 HISTORY — DX: Pneumonia, unspecified organism: J18.9

## 2017-08-26 HISTORY — DX: Malignant (primary) neoplasm, unspecified: C80.1

## 2017-08-26 NOTE — Pre-Procedure Instructions (Signed)
Progress Notes - in this encounter  Sydnee Levans, MD - 03/05/2017 3:30 PM EDT Formatting of this note may be different from the original.   Chief Complaint: Chief Complaint  Patient presents with  . Establish Care  Molly Brandt abnormal ekg  Date of Service: 03/05/2017 Date of Birth: [birth year] PCP: Melonie Florida, MD  History of Present Illness: Molly Brandt is a 52 y.o.female patient who presents in referral for evaluation after she was noted to have an abnormal EKG. She is also preoperative for a oral surgical procedure. She has occasional chest discomfort. She denies orthopnea or PND. She does have chronic shortness of breath but is on bronchodilators and chronic oxygen therapy. Electrocardiogram reveals sinus rhythm with a short PR and inverted T waves in the lateral leads. She denies syncope or presyncope. She is not very active. She has a tobacco history but states that that she has discontinued this habit.  Past Medical and Surgical History  Past Medical History Past Medical History:  Diagnosis Date  . Chronic gastritis 04/11/2015  . COPD (chronic obstructive pulmonary disease) , unspecified (CMS-HCC)  . Hepatitis C  . History of ectopic pregnancy  . Irregular Z line of esophagus 04/11/2015   Past Surgical History She has a past surgical history that includes Laparoscopic salpingectomy (Left, 07/31/2002); egd (04/11/2015); and Colonoscopy (04/11/2015).   Medications and Allergies  Current Medications  Current Outpatient Prescriptions  Medication Sig Dispense Refill  . albuterol (PROVENTIL) 2.5 mg /3 mL (0.083 %) nebulizer solution Take 3 mLs (2.5 mg total) by nebulization every 6 (six) hours as needed for Wheezing. 75 mL 12  . albuterol (VENTOLIN HFA) 90 mcg/actuation inhaler VENTOLIN HFA 108 (90 Base) MCG/ACT AERS  . cyclobenzaprine (FLEXERIL) 10 MG tablet 10 mg.  . fluticasone-salmeterol (ADVAIR DISKUS) 500-50 mcg/dose diskus inhaler ADVAIR DISKUS 500-50  MCG/DOSE AEPB  . mometasone (NASONEX) 50 mcg/actuation nasal spray NASONEX 50 MCG/ACT SUSP  . omeprazole (PRILOSEC) 40 MG DR capsule TAKE 1 CAPSULE (40 MG TOTAL) BY MOUTH ONCE DAILY. TAKE ONE TABLET 30-45 MINUTES BEFORE BREAKFAST 30 capsule 0  . PARoxetine (PAXIL) 20 MG tablet Take 1 tablet (20 mg total) by mouth once daily. (Patient not taking: Reported on 12/31/2016 ) 30 tablet 0  . tiotropium (SPIRIVA WITH HANDIHALER) 18 mcg inhalation capsule 18 mcg.  Marland Kitchen XIIDRA 5 % ophthalmic solution INSTILL 1 DROP IN BOTH EYES TWICE A DAY FOR 28 DAYS 1   No current facility-administered medications for this visit.   Allergies: Naproxen and Tramadol  Social and Family History  Social History reports that she has quit smoking. Her smoking use included Cigarettes. She has a 25.00 pack-year smoking history. She has never used smokeless tobacco. She reports that she does not drink alcohol or use drugs.  Family History Family History  Problem Relation Age of Onset  . Asthma Mother  . High blood pressure (Hypertension) Father  . Cancer Father  . Colon cancer Neg Hx  . Colon polyps Neg Hx  . Liver disease Neg Hx  . Rectal cancer Neg Hx  . Ulcers Neg Hx   Review of Systems  Review of Systems  Constitutional: Negative for chills, diaphoresis, fever, malaise/fatigue and weight loss.  HENT: Negative for congestion, ear discharge, hearing loss and tinnitus.  Eyes: Negative for blurred vision.  Respiratory: Positive for shortness of breath. Negative for cough, hemoptysis, sputum production and wheezing.  Cardiovascular: Positive for chest pain. Negative for palpitations, orthopnea, claudication, leg swelling and PND.  Gastrointestinal: Negative  for abdominal pain, blood in stool, constipation, diarrhea, heartburn, melena, nausea and vomiting.  Genitourinary: Negative for dysuria, frequency, hematuria and urgency.  Musculoskeletal: Negative for back pain, falls, joint pain and myalgias.  Skin: Negative for  itching and rash.  Neurological: Negative for dizziness, tingling, focal weakness, loss of consciousness, weakness and headaches.  Endo/Heme/Allergies: Negative for polydipsia. Does not bruise/bleed easily.  Psychiatric/Behavioral: Negative for depression, memory loss and substance abuse. The patient is not nervous/anxious.   Physical Examination   Vitals:BP 140/60  Pulse 72  Resp 12  Ht 157.5 cm (5\' 2" )  Wt 93 kg (205 lb)  BMI 37.49 kg/m  Ht:157.5 cm (5\' 2" ) Wt:93 kg (205 lb) SAY:TKZS surface area is 2.02 meters squared. Body mass index is 37.49 kg/m.  Wt Readings from Last 3 Encounters:  03/05/17 93 kg (205 lb)  12/31/16 92.4 kg (203 lb 12.8 oz)  09/23/16 93.9 kg (207 lb)   BP Readings from Last 3 Encounters:  03/05/17 140/60  12/31/16 127/84  09/23/16 120/83   General appearance appears in no acute distress  Head Mouth and Eye exam Normocephalic, without obvious abnormality, atraumatic Dentition is good Eyes appear anicteric   Neck exam Thyroid: normal  Nodes: no obvious adenopathy  LUNGS Breath Sounds: Normal Percussion: Normal  CARDIOVASCULAR JVP CV wave: no HJR: no Elevation at 90 degrees: None Carotid Pulse: normal pulsation bilaterally Bruit: None Apex: apical impulse normal  Auscultation Rhythm: normal sinus rhythm S1: normal S2: normal Clicks: no Rub: no Murmurs: no murmurs  Gallop: None ABDOMEN Liver enlargement: no Pulsatile aorta: no Ascites: no Bruits: no  EXTREMITIES Clubbing: no Edema: 1+ bilateral pedal edema Pulses: peripheral pulses symmetrical Femoral Bruits: no Amputation: no SKIN Rash: no Cyanosis: no Embolic phemonenon: no Bruising: no NEURO Alert and Oriented to person, place and time: yes Non focal: yes  PSYCH: Pt appears to have normal affect  LABS REVIEWED Last 3 CBC results: Lab Results  Component Value Date  WBC 7.2 06/01/2015  WBC 9.3 12/15/2014   Lab Results  Component Value Date  HGB 14.8  06/01/2015  HGB 14.2 12/15/2014   Lab Results  Component Value Date  HCT 45.2 06/01/2015  HCT 43.6 12/15/2014   Lab Results  Component Value Date  PLT 321 06/01/2015  PLT 314 12/15/2014   Lab Results  Component Value Date  CREATININE 0.8 06/01/2015  BUN 8 06/01/2015  NA 139 06/01/2015  K 4.1 06/01/2015  CL 102 06/01/2015  CO2 30.1 06/01/2015   Lab Results  Component Value Date  ALT 35 06/01/2015  AST 36 06/01/2015  ALKPHOS 75 06/01/2015   No results found for: TSH  Diagnostic Studies Reviewed:  EKG EKG demonstrated normal sinus rhythm, nonspecific ST and T waves changes.  Assessment and Plan   52 y.o. female with  ICD-10-CM ICD-9-CM  1. Atypical chest pain-etiology of chest pain is unclear we will proceed with Lexiscan sestamibi due to the inability ambulate to guide risk stratification prior to elective surgery as well as to evaluate the etiology of chest pain. Further recommendations after this is complete. R07.89 786.59 NM myocardial perfusion SPECT multiple (stress and rest)  ECG stress test only  2. Abnormal EKG-nonspecific changes. As per above will evaluate for ischemic etiology R94.31 794.31  3. COPD, moderate , unspecified (CMS-HCC)-continue to refrain from tobacco. Continue with bronchodilators. J44.9 496   Return if symptoms worsen or fail to improve.  These notes generated with voice recognition software. I apologize for typographical errors.  [place],  MD       Plan of Treatment - as of this encounter  Upcoming Encounters Upcoming Encounters  Date Type Specialty Care Team Description  04/08/2017 Office Visit Pulmonology Erby Pian, MD  [facility]  [place], [place] [address]  [phone]  [phone] (Fax)     Scheduled Tests Scheduled Tests  Name Priority Associated Diagnoses Order Schedule  NM myocardial perfusion SPECT multiple (stress and rest) Routine Atypical  chest pain  Expected: 03/12/2017, Expires: 03/05/2018  ECG stress test only Routine Atypical chest pain  1 Occurrences starting 03/05/2017 until 03/05/2018   Procedures - in this encounter  Procedure Name Priority Date/Time Associated Diagnosis Comments  ECG  02/20/2017 12:00 AM EDT  Results for this procedure are in the results section.    ECG Results - in this encounter   ECG ECG  Narrative Performed At  This result has an attachment that is not available.  Ordered by an unspecified provider.        Visit Diagnoses   Diagnosis  Atypical chest pain - Primary  Other chest pain   Abnormal EKG  Nonspecific abnormal electrocardiogram (ECG) (EKG)   COPD, moderate , unspecified (CMS-HCC)   Images Document Information  Primary Care Provider Other Service Providers Document Coverage Dates  Melonie Florida MD (Jun. 09, 2015 - Present) [phone] (Work) [phone] (Fax) [place], [place] [address]   Sep. 26, 2018   [facility] [address] [place], [place] [address]   Encounter Providers Encounter Date  Sydnee Levans MD (Attending) [phone] (Work) [phone] (Fax) [place],  [address]  Sep. 26, 2018

## 2017-08-26 NOTE — Pre-Procedure Instructions (Signed)
Progress Notes - documented in this encounter  Table of Contents for Progress Notes  Erby Pian, MD - 07/28/2017 3:30 PM EST  Marja Kays, RT - 07/28/2017 3:30 PM EST  Erby Pian, MD - 07/28/2017 3:30 PM EST    Erby Pian, MD - 07/28/2017 3:30 PM EST Formatting of this note might be different from the original.  COPD  History of Present Illness: Molly Brandt is a 52 y.o. female presents to clinic for copd, on oxygen, chronic dyspnea, no worse. 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 See last notes.   Current Medications:  Current Outpatient Medications  Medication Sig Dispense Refill  . albuterol (PROVENTIL) 2.5 mg /3 mL (0.083 %) nebulizer solution Take 3 mLs (2.5 mg total) by nebulization every 6 (six) hours as needed for Wheezing. 75 mL 12  . albuterol (VENTOLIN HFA) 90 mcg/actuation inhaler VENTOLIN HFA 108 (90 Base) MCG/ACT AERS  . fluticasone-salmeterol (ADVAIR DISKUS) 500-50 mcg/dose diskus inhaler ADVAIR DISKUS 500-50 MCG/DOSE AEPB  . mometasone (NASONEX) 50 mcg/actuation nasal spray NASONEX 50 MCG/ACT SUSP  . omeprazole (PRILOSEC) 40 MG DR capsule TAKE 1 CAPSULE (40 MG TOTAL) BY MOUTH ONCE DAILY. TAKE ONE TABLET 30-45 MINUTES BEFORE BREAKFAST 30 capsule 0  . PARoxetine (PAXIL) 20 MG tablet Take 1 tablet (20 mg total) by mouth once daily. 30 tablet 0  . tiotropium (SPIRIVA WITH HANDIHALER) 18 mcg inhalation capsule 18 mcg.  Marland Kitchen XIIDRA 5 % ophthalmic solution INSTILL 1 DROP IN BOTH EYES TWICE A DAY FOR 28 DAYS 1  . cyclobenzaprine (FLEXERIL) 10 MG tablet 10 mg.   No current facility-administered medications for this visit.   Problem List:  Patient Active Problem List  Diagnosis  . COPD, moderate , unspecified (CMS-HCC)  . Tobacco abuse  . Obesity, unspecified  . Gastroesophageal reflux disease  . Abnormal EKG   History: Past Medical History:  Diagnosis Date  . Chronic gastritis  04/11/2015  . COPD (chronic obstructive pulmonary disease) , unspecified (CMS-HCC)  . Hepatitis C  . History of ectopic pregnancy  . Irregular Z line of esophagus 04/11/2015   Past Surgical History:  Procedure Laterality Date  . COLONOSCOPY 04/11/2015  Patient not cleaned out. Scope not inserted./Reschedule at next available/MUS  . EGD 04/11/2015  Chronic gastritis/GERD/No Repeat/MUS  . Laparoscopic salpingectomy Left 07/31/2002  Pathology showing partially decidualized endometrium without trophoblastic tissue; fallopian tube showing intramural trophoblastic tissue and chorionic villi in blood clot, consistent with ectopic   Family History  Problem Relation Age of Onset  . Asthma Mother  . High blood pressure (Hypertension) Father  . Cancer Father  . Colon cancer Neg Hx  . Colon polyps Neg Hx  . Liver disease Neg Hx  . Rectal cancer Neg Hx  . Ulcers Neg Hx   Social History   Socioeconomic History  . Marital status: Single  Spouse name: Not on file  . Number of children: Not on file  . Years of education: Not on file  . Highest education level: Not on file  Occupational History  . Not on file  Social Needs  . Financial resource strain: Not on file  . Food insecurity:  Worry: Not on file  Inability: Not on file  . Transportation needs:  Medical: Not on file  Non-medical: Not on file  Tobacco Use  . Smoking status: Current Some Day Smoker  Packs/day: 1.00  Years: 39.00  Pack years:  39.00  Types: Cigarettes  . Smokeless tobacco: Never Used  Substance and Sexual Activity  . Alcohol use: No  . Drug use: No  . Sexual activity: Defer  Lifestyle  . Physical activity:  Days per week: Not on file  Minutes per session: Not on file  . Stress: Not on file  Relationships  . Social connections:  Talks on phone: Not on file  Gets together: Not on file  Attends religious service: Not on file  Active member of club or organization: Not on file  Attends meetings of clubs  or organizations: Not on file  Relationship status: Not on file  . Intimate partner violence:  Fear of current or ex partner: Not on file  Emotionally abused: Not on file  Physically abused: Not on file  Forced sexual activity: Not on file  Other Topics Concern  . Not on file  Social History Narrative  . Not on file   Allergies:  Naproxen and Tramadol  Review of Systems: As per above. Pretty much unchanged. No other associated cardiopulmonary, GI, GU, dermatological symptoms today. No focal neurological symptoms or psychological changes.   Physical Exam: BP 118/66  Pulse 78  Temp 36.8 C (98.2 F) (Oral)  Ht 157.5 cm (5\' 2" )  Wt 90.7 kg (200 lb)  SpO2 98% Comment: on 2.5 LPM [place]  BMI 36.58 kg/m 90.7 kg (200 lb) 98% on 2.5 liters General: NAD. Able to speak in complete sentences without cough or dyspnea, [place] 02 is on, over weight HEENT: Normocephalic, nontraumatic. Extraocular movements intact NECK: Supple. No JVD, nodes, thyromegaly CV: RRR no murmurs, gallops, rubs PULM: Normal respiratory effort, Clear  EXTREMITIES: No significant edema, cyanosis or Homans'signs SKIN: Fair turgor. No rashes LYMPHATIC: No nodes NEURO: No gross deficits, no change PSYCH: Appropriate affect, alert,oriented   SPIROMETRY: FVC was 2.21 liters, 77% of predicted FEV1 was 1.65, 70% of predicted FEV1 ratio was 75 FEF 25-75% liters per second was 53% of predicted  LUNG VOLUMES: TLC was 83% of predicted RV was 95% of predicted  DIFFUSION CAPACITY: DLCO was 48% of predicted DLCO/VA was 108% of predicted  FLOW VOLUME LOOP: Expiratory flow volume loop is delayed  Impression Mild-moderate obstruction on spirometry Lung volumes are in normal range DLCO is severely decreased  *Compared to Previous Study, numbers are consistent, if not slightly improved.  Impression: Copd, on oxygen.chronic dyspnea no worse. Over weight. No recent flares, frontal sinus is non painfull and not full as  last visit, s/p levaquin.  Continue advair 500, albuterol, spiriva Increase the the oxygen to 3 liters with activity Weight loss Follow up in 4 months.     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 female reports that she has quit smoking. Her smoking use included cigarettes. She has a 25.00 pack-year smoking history. She has never used smokeless tobacco.  SPIROMETRY: FVC was 2.21 liters, 77% of predicted FEV1 was 1.65, 70% of predicted FEV1 ratio was 75 FEF 25-75% liters per second was 53% of predicted  LUNG VOLUMES: TLC was 83% of predicted RV was 95% of predicted  DIFFUSION CAPACITY: DLCO was 48% of predicted DLCO/VA was 108% of predicted  FLOW VOLUME LOOP: Expiratory flow  volume loop is delayed  Impression Mild-moderate obstruction on spirometry Lung volumes are in normal range DLCO is severely decreased  *Compared to Previous Study, numbers are consistent, if not slightly improved.   Electronically signed by Erby Pian, MD at 07/28/2017 4:39 PM EST  Back to top of Progress Notes Erby Pian, MD - 07/28/2017 3:30 PM EST    Electronically signed by Erby Pian, MD at 07/28/2017 4:39 PM EST  Back to top of Progress Notes   Plan of Treatment - documented as of this encounter  Upcoming Encounters Upcoming Encounters  Date Type Specialty Care Team Description  08/28/2017 Initial consult Otolaryngology Feliz Beam, MD  [facility] [place], [place] [address]  [phone]  [phone] (Fax)    10/23/2017 Office Visit Pulmonology Erby Pian, MD  [facility]  [place], [place] [address]  [phone]  [phone] (Fax)     Scheduled Tests Scheduled Tests  Name Priority Associated Diagnoses Order Schedule  Carbon monoxide diffusing capacity (DLCO) Routine SOB (shortness of breath)  Ordered: 07/28/2017  Flow Volume Loop Routine SOB (shortness of breath)  Ordered: 07/28/2017  Lung Volumes Routine SOB (shortness of breath)  Ordered: 07/28/2017   Procedures - documented in this encounter  Procedure Name Priority Date/Time Associated Diagnosis Comments  PULMONARY FUNCTION TEST  07/28/2017 12:00 AM EST  Results for this procedure are in the results section.    Miscellaneous Results - documented in this encounter   PULMONARY FUNCTION TEST (07/28/2017 12:00 AM EST) PULMONARY FUNCTION TEST (07/28/2017 12:00 AM EST)  Narrative Performed At  This result has an attachment that is not available.  Ordered by an unspecified provider.        Visit Diagnoses - documented in this encounter  Diagnosis  SOB (shortness of breath) - Primary  Shortness of breath   COPD, moderate , unspecified (CMS-HCC)   On supplemental oxygen therapy  Dependence on supplemental oxygen   Overweight    Images Document Information  Primary Care Provider Other Service Providers Document Coverage Dates  Melonie Florida MD (Jun. 09, 2015 - Present) [phone] (Work) [phone] (Fax) [place], [place] [address]   Feb. 18, 2019   [facility] [address] [place], [place] [address]   Encounter Providers Encounter Date  Erby Pian MD (Attending) [phone] (Work) [phone] (Fax) [place],  [address]  Feb. 18, 2019

## 2017-08-26 NOTE — Patient Instructions (Addendum)
Your procedure is scheduled on: 09-03-17 [facility] Report to Same Day Surgery 2nd floor medical mall [facility] Entrance-take elevator on left to 2nd floor.  Check in with surgery information desk.) To find out your arrival time please call [phone] between 1PM - 3PM on 09-02-17 TUESDAY  Remember: Instructions that are not followed completely may result in serious medical risk, up to and including death, or upon the discretion of your surgeon and anesthesiologist your surgery may need to be rescheduled.    _x___ 1. Do not eat food after midnight the night before your procedure. NO GUM OR CANDY AFTER MIDNIGHT.  You may drink clear liquids up to 2 hours before you are scheduled to arrive at the hospital for your procedure.  Do not drink clear liquids within 2 hours of your scheduled arrival to the hospital.  Clear liquids include  --Water or Apple juice without pulp  --Clear carbohydrate beverage such as ClearFast or Gatorade  --Black Coffee or Clear Tea (No milk, no creamers, do not add anything to the coffee or Tea    __x__ 2. No Alcohol for 24 hours before or after surgery.   __x__3. No Smoking or e-cigarettes for 24 prior to surgery.  Do not use any chewable tobacco products for at least 6 hour prior to surgery   ____  4. Bring all medications with you on the day of surgery if instructed.    __x__ 5. Notify your doctor if there is any change in your medical condition     (cold, fever, infections).    x___6. On the morning of surgery brush your teeth with toothpaste and water.  You may rinse your mouth with mouth wash if you wish.  Do not swallow any toothpaste or mouthwash.   Do not wear jewelry, make-up, hairpins, clips or nail polish.  Do not wear lotions, powders, or perfumes. You may wear deodorant.  Do not shave 48 hours prior to surgery. Men may shave face and neck.  Do not bring valuables to the hospital.    [facility] is not responsible for any belongings or  valuables.               Contacts, dentures or bridgework may not be worn into surgery.  Leave your suitcase in the car. After surgery it may be brought to your room.  For patients admitted to the hospital, discharge time is determined by your treatment team.  _  Patients discharged the day of surgery will not be allowed to drive home.  You will need someone to drive you home and stay with you the night of your procedure.    Please read over the following fact sheets that you were given:   [facility] Preparing for Surgery and or MRSA Information   _x___ TAKE THE FOLLOWING MEDICATION THE MORNING OF SURGERY WITH A SMALL SIP OF WATER. These include:  1. PRILOSEC (OMEPRAZOLE)  2.  3.  4.  5.  6.  ____Fleets enema or Magnesium Citrate as directed.   ____ Use CHG Soap or sage wipes as directed on instruction sheet   _X___ Use inhalers on the day of surgery and bring to hospital day of [place].  BRING YOUR ALBUTEROL [place]  ____ Stop Metformin and Janumet 2 days prior to surgery.    ____ Take 1/2 of usual insulin dose the night before surgery and none on the morning  surgery.   _x___ Follow recommendations from Cardiologist, Pulmonologist or PCP regarding stopping Aspirin, Coumadin, Plavix ,Eliquis, Effient, or Pradaxa, and Pletal-STOP ASPIRIN NOW  X____Stop Anti-inflammatories such as Advil, Aleve, Ibuprofen, Motrin, Naproxen, Naprosyn, Goodies powders or aspirin products NOW-OK to take Tylenol    ____ Stop supplements until after surgery.     ____ Bring C-Pap to the hospital.

## 2017-08-27 ENCOUNTER — Encounter
Admission: RE | Admit: 2017-08-27 | Discharge: 2017-08-27 | Disposition: A | Discharge status: Home / Self Care | Payer: Medicaid Other | Source: Ambulatory Visit | Attending: Otolaryngology | Admitting: Otolaryngology

## 2017-08-27 ENCOUNTER — Ambulatory Visit
Admission: RE | Admit: 2017-08-27 | Discharge: 2017-08-27 | Disposition: A | Discharge status: Home / Self Care | Payer: Medicaid Other | Source: Ambulatory Visit | Attending: Otolaryngology | Admitting: Otolaryngology

## 2017-08-27 DIAGNOSIS — J449 Chronic obstructive pulmonary disease, unspecified: Secondary | ICD-10-CM | POA: Diagnosis present

## 2017-08-27 DIAGNOSIS — C801 Malignant (primary) neoplasm, unspecified: Secondary | ICD-10-CM | POA: Insufficient documentation

## 2017-08-27 DIAGNOSIS — R918 Other nonspecific abnormal finding of lung field: Secondary | ICD-10-CM | POA: Insufficient documentation

## 2017-08-27 DIAGNOSIS — R9431 Abnormal electrocardiogram [ECG] [EKG]: Secondary | ICD-10-CM | POA: Insufficient documentation

## 2017-08-27 DIAGNOSIS — Z01818 Encounter for other preprocedural examination: Secondary | ICD-10-CM | POA: Insufficient documentation

## 2017-08-27 LAB — COMPREHENSIVE METABOLIC PANEL
ALT: 42 U/L (ref 14–54)
ANION GAP: 8 (ref 5–15)
AST: 42 U/L — ABNORMAL HIGH (ref 15–41)
Albumin: 3.9 g/dL (ref 3.5–5.0)
Alkaline Phosphatase: 84 U/L (ref 38–126)
BUN: 11 mg/dL (ref 6–20)
CHLORIDE: 103 mmol/L (ref 101–111)
CO2: 28 mmol/L (ref 22–32)
Calcium: 9 mg/dL (ref 8.9–10.3)
Creatinine, Ser: 0.58 mg/dL (ref 0.44–1.00)
Glucose, Bld: 106 mg/dL — ABNORMAL HIGH (ref 65–99)
POTASSIUM: 3.8 mmol/L (ref 3.5–5.1)
Sodium: 139 mmol/L (ref 135–145)
Total Bilirubin: 0.5 mg/dL (ref 0.3–1.2)
Total Protein: 7.8 g/dL (ref 6.5–8.1)

## 2017-08-27 LAB — CBC
HCT: 44.9 % (ref 35.0–47.0)
Hemoglobin: 14.9 g/dL (ref 12.0–16.0)
MCH: 29.2 pg (ref 26.0–34.0)
MCHC: 33.2 g/dL (ref 32.0–36.0)
MCV: 87.8 fL (ref 80.0–100.0)
PLATELETS: 255 10*3/uL (ref 150–440)
RBC: 5.11 MIL/uL (ref 3.80–5.20)
RDW: 13.5 % (ref 11.5–14.5)
WBC: 8.8 10*3/uL (ref 3.6–11.0)

## 2017-08-29 ENCOUNTER — Other Ambulatory Visit: Payer: Self-pay | Admitting: Cardiology

## 2017-08-29 DIAGNOSIS — R9431 Abnormal electrocardiogram [ECG] [EKG]: Secondary | ICD-10-CM

## 2017-08-29 NOTE — Pre-Procedure Instructions (Signed)
CXR sent to anesthesia for review.  Also asked to review "read" EKG.

## 2017-09-01 ENCOUNTER — Encounter
Admission: RE | Admit: 2017-09-01 | Discharge: 2017-09-01 | Disposition: A | Discharge status: Home / Self Care | Payer: Medicaid Other | Source: Ambulatory Visit | Attending: Cardiology | Admitting: Cardiology

## 2017-09-01 DIAGNOSIS — R9431 Abnormal electrocardiogram [ECG] [EKG]: Secondary | ICD-10-CM

## 2017-09-01 LAB — NM MYOCAR MULTI W/SPECT W/WALL MOTION / EF
CHL CUP RESTING HR STRESS: 64 {beats}/min
CSEPEDS: 0 s
CSEPEW: 1 METS
CSEPHR: 56 %
Exercise duration (min): 1 min
LV dias vol: 75 mL (ref 46–106)
LV sys vol: 33 mL
MPHR: 166 {beats}/min
Peak HR: 93 {beats}/min
SDS: 0
SRS: 2
SSS: 0
TID: 1.09

## 2017-09-01 MED ORDER — TECHNETIUM TC 99M TETROFOSMIN IV KIT
12.9200 | PACK | Freq: Once | INTRAVENOUS | Status: AC | PRN
  Administered 2017-09-01: 12.92 via INTRAVENOUS

## 2017-09-01 MED ORDER — REGADENOSON 0.4 MG/5ML IV SOLN
0.4000 mg | Freq: Once | INTRAVENOUS | Status: AC
  Administered 2017-09-01: 0.4 mg via INTRAVENOUS
  Filled 2017-09-01: qty 5

## 2017-09-01 MED ORDER — TECHNETIUM TC 99M TETROFOSMIN IV KIT
30.9500 | PACK | Freq: Once | INTRAVENOUS | Status: AC | PRN
  Administered 2017-09-01: 30.95 via INTRAVENOUS

## 2017-09-01 NOTE — Pre-Procedure Instructions (Signed)
AS INSTRUCTED BY DR IDPOEUM CXR CALLED AND FAXED TO DR Raul Del. SPOKE WITH ASHLEY. LM FOR BECKY AT DR Reola Mosher

## 2017-09-02 NOTE — Pre-Procedure Instructions (Signed)
CLEARED AS MEDIUM RISK BY DR Raul Del

## 2017-09-03 ENCOUNTER — Ambulatory Visit: Payer: Medicaid Other | Admitting: Certified Registered"

## 2017-09-03 ENCOUNTER — Ambulatory Visit
Admission: RE | Admit: 2017-09-03 | Discharge: 2017-09-03 | Disposition: A | Discharge status: Home / Self Care | Payer: Medicaid Other | Source: Ambulatory Visit | Attending: Otolaryngology | Admitting: Otolaryngology

## 2017-09-03 ENCOUNTER — Ambulatory Visit: Payer: Medicaid Other | Admitting: Anesthesiology

## 2017-09-03 ENCOUNTER — Other Ambulatory Visit: Payer: Self-pay

## 2017-09-03 ENCOUNTER — Encounter: Payer: Self-pay | Admitting: *Deleted

## 2017-09-03 ENCOUNTER — Encounter
Admission: RE | Disposition: A | Discharge status: Home / Self Care | Payer: Self-pay | Source: Ambulatory Visit | Attending: Otolaryngology

## 2017-09-03 DIAGNOSIS — B1920 Unspecified viral hepatitis C without hepatic coma: Secondary | ICD-10-CM | POA: Insufficient documentation

## 2017-09-03 DIAGNOSIS — J449 Chronic obstructive pulmonary disease, unspecified: Secondary | ICD-10-CM | POA: Diagnosis not present

## 2017-09-03 DIAGNOSIS — J383 Other diseases of vocal cords: Secondary | ICD-10-CM | POA: Diagnosis present

## 2017-09-03 DIAGNOSIS — F17200 Nicotine dependence, unspecified, uncomplicated: Secondary | ICD-10-CM | POA: Diagnosis not present

## 2017-09-03 DIAGNOSIS — K219 Gastro-esophageal reflux disease without esophagitis: Secondary | ICD-10-CM | POA: Diagnosis not present

## 2017-09-03 DIAGNOSIS — Z79899 Other long term (current) drug therapy: Secondary | ICD-10-CM | POA: Diagnosis not present

## 2017-09-03 DIAGNOSIS — C029 Malignant neoplasm of tongue, unspecified: Secondary | ICD-10-CM | POA: Insufficient documentation

## 2017-09-03 HISTORY — PX: EXCISION OF TONGUE LESION: SHX6434

## 2017-09-03 HISTORY — PX: DIRECT LARYNGOSCOPY: SHX5326

## 2017-09-03 SURGERY — LARYNGOSCOPY, DIRECT
Anesthesia: General | Laterality: Right | Wound class: Clean Contaminated

## 2017-09-03 MED ORDER — REMIFENTANIL HCL 1 MG IV SOLR
INTRAVENOUS | Status: AC
  Filled 2017-09-03: qty 1000

## 2017-09-03 MED ORDER — SODIUM CHLORIDE 0.9 % IV SOLN
INTRAVENOUS | Status: DC | PRN
  Administered 2017-09-03: 20 ug/min via INTRAVENOUS

## 2017-09-03 MED ORDER — LIDOCAINE-EPINEPHRINE 1 %-1:100000 IJ SOLN
INTRAMUSCULAR | Status: AC
  Filled 2017-09-03: qty 1

## 2017-09-03 MED ORDER — FENTANYL CITRATE (PF) 100 MCG/2ML IJ SOLN
INTRAMUSCULAR | Status: DC | PRN
  Administered 2017-09-03: 50 ug via INTRAVENOUS

## 2017-09-03 MED ORDER — FENTANYL CITRATE (PF) 100 MCG/2ML IJ SOLN
INTRAMUSCULAR | Status: AC
  Filled 2017-09-03: qty 2

## 2017-09-03 MED ORDER — DEXAMETHASONE SODIUM PHOSPHATE 10 MG/ML IJ SOLN
INTRAMUSCULAR | Status: DC | PRN
  Administered 2017-09-03: 8 mg via INTRAVENOUS

## 2017-09-03 MED ORDER — SUGAMMADEX SODIUM 200 MG/2ML IV SOLN
INTRAVENOUS | Status: AC
  Filled 2017-09-03: qty 2

## 2017-09-03 MED ORDER — SUCCINYLCHOLINE CHLORIDE 20 MG/ML IJ SOLN
INTRAMUSCULAR | Status: AC
  Filled 2017-09-03: qty 1

## 2017-09-03 MED ORDER — SUCCINYLCHOLINE CHLORIDE 20 MG/ML IJ SOLN
INTRAMUSCULAR | Status: DC | PRN
  Administered 2017-09-03: 100 mg via INTRAVENOUS

## 2017-09-03 MED ORDER — GLYCOPYRROLATE 0.2 MG/ML IJ SOLN
INTRAMUSCULAR | Status: DC | PRN
  Administered 2017-09-03: 0.2 mg via INTRAVENOUS

## 2017-09-03 MED ORDER — LIDOCAINE-EPINEPHRINE (PF) 1 %-1:200000 IJ SOLN
INTRAMUSCULAR | Status: AC
  Filled 2017-09-03: qty 30

## 2017-09-03 MED ORDER — MIDAZOLAM HCL 2 MG/2ML IJ SOLN
INTRAMUSCULAR | Status: AC
  Filled 2017-09-03: qty 2

## 2017-09-03 MED ORDER — ROCURONIUM BROMIDE 50 MG/5ML IV SOLN
INTRAVENOUS | Status: AC
  Filled 2017-09-03: qty 1

## 2017-09-03 MED ORDER — EPHEDRINE SULFATE 50 MG/ML IJ SOLN
INTRAMUSCULAR | Status: DC | PRN
  Administered 2017-09-03: 10 mg via INTRAVENOUS
  Administered 2017-09-03 (×2): 5 mg via INTRAVENOUS

## 2017-09-03 MED ORDER — DEXAMETHASONE SODIUM PHOSPHATE 10 MG/ML IJ SOLN
INTRAMUSCULAR | Status: AC
  Filled 2017-09-03: qty 1

## 2017-09-03 MED ORDER — LACTATED RINGERS IV SOLN
INTRAVENOUS | Status: DC
  Administered 2017-09-03: 07:00:00 via INTRAVENOUS

## 2017-09-03 MED ORDER — LIDOCAINE HCL (PF) 2 % IJ SOLN
INTRAMUSCULAR | Status: AC
  Filled 2017-09-03: qty 10

## 2017-09-03 MED ORDER — PROPOFOL 10 MG/ML IV BOLUS
INTRAVENOUS | Status: AC
  Filled 2017-09-03: qty 20

## 2017-09-03 MED ORDER — OXYMETAZOLINE HCL 0.05 % NA SOLN
NASAL | Status: AC
  Filled 2017-09-03: qty 15

## 2017-09-03 MED ORDER — GLYCOPYRROLATE 0.2 MG/ML IJ SOLN
INTRAMUSCULAR | Status: AC
  Filled 2017-09-03: qty 1

## 2017-09-03 MED ORDER — AMOXICILLIN 400 MG/5ML PO SUSR: ORAL | 0 refills | Status: DC

## 2017-09-03 MED ORDER — LIDOCAINE HCL (CARDIAC) 20 MG/ML IV SOLN
INTRAVENOUS | Status: DC | PRN
  Administered 2017-09-03: 80 mg via INTRAVENOUS

## 2017-09-03 MED ORDER — PROPOFOL 10 MG/ML IV BOLUS
INTRAVENOUS | Status: DC | PRN
  Administered 2017-09-03: 160 mg via INTRAVENOUS

## 2017-09-03 MED ORDER — ROCURONIUM BROMIDE 100 MG/10ML IV SOLN
INTRAVENOUS | Status: DC | PRN
  Administered 2017-09-03: 5 mg via INTRAVENOUS
  Administered 2017-09-03: 35 mg via INTRAVENOUS

## 2017-09-03 MED ORDER — ONDANSETRON HCL 4 MG/2ML IJ SOLN
INTRAMUSCULAR | Status: DC | PRN
  Administered 2017-09-03: 4 mg via INTRAVENOUS

## 2017-09-03 MED ORDER — REMIFENTANIL HCL 1 MG IV SOLR
INTRAVENOUS | Status: DC | PRN
  Administered 2017-09-03: .15 ug/kg/min via INTRAVENOUS

## 2017-09-03 MED ORDER — HYDROCODONE-ACETAMINOPHEN 7.5-325 MG/15ML PO SOLN: ORAL | 0 refills | Status: DC

## 2017-09-03 MED ORDER — PHENYLEPHRINE HCL 10 MG/ML IJ SOLN
INTRAMUSCULAR | Status: DC | PRN
  Administered 2017-09-03 (×4): 100 ug via INTRAVENOUS

## 2017-09-03 MED ORDER — ONDANSETRON HCL 4 MG/2ML IJ SOLN: 4.0000 mg | Freq: Once | INTRAMUSCULAR | Status: DC | PRN

## 2017-09-03 MED ORDER — FENTANYL CITRATE (PF) 100 MCG/2ML IJ SOLN: 25.0000 ug | INTRAMUSCULAR | Status: DC | PRN

## 2017-09-03 MED ORDER — MIDAZOLAM HCL 2 MG/2ML IJ SOLN
INTRAMUSCULAR | Status: DC | PRN
  Administered 2017-09-03 (×2): 1 mg via INTRAVENOUS

## 2017-09-03 MED ORDER — LIDOCAINE-EPINEPHRINE (PF) 1 %-1:200000 IJ SOLN
INTRAMUSCULAR | Status: DC | PRN
  Administered 2017-09-03: 3 mL

## 2017-09-03 MED ORDER — SUGAMMADEX SODIUM 200 MG/2ML IV SOLN
INTRAVENOUS | Status: DC | PRN
  Administered 2017-09-03: 375 mg via INTRAVENOUS

## 2017-09-03 MED ORDER — ALBUTEROL SULFATE HFA 108 (90 BASE) MCG/ACT IN AERS
INHALATION_SPRAY | RESPIRATORY_TRACT | Status: DC | PRN
  Administered 2017-09-03: 4 via RESPIRATORY_TRACT

## 2017-09-03 MED ORDER — ONDANSETRON HCL 4 MG/2ML IJ SOLN
INTRAMUSCULAR | Status: AC
  Filled 2017-09-03: qty 2

## 2017-09-03 MED ORDER — PHENYLEPHRINE HCL 10 MG/ML IJ SOLN
INTRAMUSCULAR | Status: AC
  Filled 2017-09-03: qty 1

## 2017-09-03 MED ORDER — HYDROCODONE-ACETAMINOPHEN 7.5-325 MG/15ML PO SOLN
ORAL | Status: AC
  Administered 2017-09-03: 15 mL
  Filled 2017-09-03: qty 15

## 2017-09-03 SURGICAL SUPPLY — 23 items
BLADE SURG 15 STRL LF DISP TIS (BLADE) ×2 IMPLANT
BLADE SURG 15 STRL SS (BLADE) ×2
CANISTER SUCT 1200ML W/VALVE (MISCELLANEOUS) ×4 IMPLANT
CUP MEDICINE 2OZ PLAST GRAD ST (MISCELLANEOUS) ×4 IMPLANT
DRSG TELFA 4X3 1S NADH ST (GAUZE/BANDAGES/DRESSINGS) ×4 IMPLANT
GLOVE BIO SURGEON STRL SZ7.5 (GLOVE) ×4 IMPLANT
GOWN STRL REUS W/ TWL LRG LVL3 (GOWN DISPOSABLE) ×4 IMPLANT
GOWN STRL REUS W/TWL LRG LVL3 (GOWN DISPOSABLE) ×4
IV SET EXTENSION MINI BORE EPI (IV SETS) ×4 IMPLANT
LABEL OR SOLS (LABEL) ×4 IMPLANT
NDL ENDOSCOPIC URO 20G (NEEDLE) ×4 IMPLANT
NEEDLE FILTER BLUNT 18X 1/2SAF (NEEDLE) ×2
NEEDLE FILTER BLUNT 18X1 1/2 (NEEDLE) ×2 IMPLANT
NS IRRIG 500ML POUR BTL (IV SOLUTION) ×4 IMPLANT
PACK HEAD/NECK (MISCELLANEOUS) ×4 IMPLANT
PATTIES SURGICAL .5 X.5 (GAUZE/BANDAGES/DRESSINGS) ×4 IMPLANT
SOL ANTI-FOG 6CC FOG-OUT (MISCELLANEOUS) ×2 IMPLANT
SOL FOG-OUT ANTI-FOG 6CC (MISCELLANEOUS) ×2
SUT SILK 2 0 SH (SUTURE) ×4 IMPLANT
SUT VIC AB 4-0 RB1 18 (SUTURE) IMPLANT
SUT VIC AB 4-0 RB1 27 (SUTURE) ×2
SUT VIC AB 4-0 RB1 27X BRD (SUTURE) ×2 IMPLANT
SUT VICRYL+ 3-0 27IN RB-1 (SUTURE) IMPLANT

## 2017-09-03 NOTE — Anesthesia Procedure Notes (Signed)
Procedure Name: Intubation Performed by: Lance Muss, CRNA Pre-anesthesia Checklist: Patient identified, Patient being monitored, Timeout performed, Emergency Drugs available and Suction available Patient Re-evaluated:Patient Re-evaluated prior to induction Oxygen Delivery Method: Circle system utilized Preoxygenation: Pre-oxygenation with 100% oxygen Induction Type: IV induction Ventilation: Mask ventilation without difficulty Laryngoscope Size: Mac and 3 Grade View: Grade I Tube type: Oral Tube size: 6.0 mm Number of attempts: 1 Airway Equipment and Method: Stylet Placement Confirmation: ETT inserted through vocal cords under direct vision,  positive ETCO2 and breath sounds checked- equal and bilateral Tube secured with: Tape Dental Injury: Teeth and Oropharynx as per pre-operative assessment

## 2017-09-03 NOTE — H&P (Signed)
History and physical reviewed and will be scanned in later. No change in medical status reported by the patient or family, appears stable for surgery. Has had surgical clearance from cardiology and has seen Dr. Raul Del recently, who manages her pulmonary problems and felt they were stable. Lungs today are CTA, and she denies any new illness.All questions regarding the procedure answered, and patient (or family if a child) expressed understanding of the procedure.  Molly Brandt @TODAY @

## 2017-09-03 NOTE — Discharge Instructions (Signed)

## 2017-09-03 NOTE — Transfer of Care (Signed)
Immediate Anesthesia Transfer of Care Note  Patient: Molly Brandt  Procedure(s) Performed: DIRECT LARYNGOSCOPY WITH EXCISION OF VOCAL CORD LESION (N/A ) EXCISION OF TONGUE LESION (Right )  Patient Location: PACU  Anesthesia Type:General  Level of Consciousness: awake and responds to stimulation  Airway & Oxygen Therapy: Patient Spontanous Breathing and Patient connected to face mask oxygen  Post-op Assessment: Report given to RN and Post -op Vital signs reviewed and stable  Post vital signs: Reviewed and stable  Last Vitals:  Vitals Value Taken Time  BP 115/70 09/03/2017  8:50 AM  Temp    Pulse 92 09/03/2017  8:50 AM  Resp 22 09/03/2017  8:50 AM  SpO2 100 % 09/03/2017  8:50 AM  Vitals shown include unvalidated device data.  Last Pain:  Vitals:   09/03/17 0610  TempSrc: Tympanic  PainSc: 0-No pain         Complications: No apparent anesthesia complications

## 2017-09-03 NOTE — Anesthesia Preprocedure Evaluation (Signed)
Anesthesia Evaluation  Patient identified by MRN, date of birth, ID band Patient awake    Reviewed: Allergy & Precautions, H&P , NPO status , Patient's Chart, lab work & pertinent test results, reviewed documented beta blocker date and time   Airway Mallampati: III  TM Distance: >3 FB Neck ROM: full    Dental  (+) Poor Dentition, Teeth Intact   Pulmonary neg pulmonary ROS, shortness of breath and with exertion, pneumonia, COPD, Current Smoker,    Pulmonary exam normal        Cardiovascular Exercise Tolerance: Poor negative cardio ROS Normal cardiovascular exam Rhythm:regular Rate:Normal  Cleared by Ubaldo Glassing for above as low risk based on Lexiscan.  JA   Neuro/Psych negative neurological ROS  negative psych ROS   GI/Hepatic negative GI ROS, Neg liver ROS, GERD  Medicated,(+) Hepatitis -, C  Endo/Other  negative endocrine ROS  Renal/GU negative Renal ROS  negative genitourinary   Musculoskeletal   Abdominal   Peds  Hematology negative hematology ROS (+)   Anesthesia Other Findings Past Medical History: No date: Cancer ([place]) 04/11/2015: Chronic gastritis No date: COPD (chronic obstructive pulmonary disease) (HCC) No date: Dyspnea No date: Ectopic pregnancy No date: GERD (gastroesophageal reflux disease) No date: Hepatitis     Comment:  Hepatitis C No date: Pneumonia Past Surgical History: 04/11/2015: COLONOSCOPY WITH PROPOFOL; N/A     Comment:  Procedure: COLONOSCOPY WITH PROPOFOL;  Surgeon: Lollie Sails, MD;  Location: [facility] ENDOSCOPY;  Service:               Endoscopy;  Laterality: N/A; No date: DIAGNOSTIC LAPAROSCOPY 04/11/2015: ESOPHAGOGASTRODUODENOSCOPY (EGD) WITH PROPOFOL; N/A     Comment:  Procedure: ESOPHAGOGASTRODUODENOSCOPY (EGD) WITH               PROPOFOL;  Surgeon: Lollie Sails, MD;  Location:               [facility] ENDOSCOPY;  Service: Endoscopy;  Laterality: N/A; No date:  LAPAROSCOPIC UNILATERAL SALPINGECTOMY; Left No date: MOUTH SURGERY BMI    Body Mass Index:  36.31 kg/m     Reproductive/Obstetrics negative OB ROS                             Anesthesia Physical Anesthesia Plan  ASA: III  Anesthesia Plan: General ETT   Post-op Pain Management:    Induction:   PONV Risk Score and Plan: 4 or greater  Airway Management Planned:   Additional Equipment:   Intra-op Plan:   Post-operative Plan:   Informed Consent: I have reviewed the patients History and Physical, chart, labs and discussed the procedure including the risks, benefits and alternatives for the proposed anesthesia with the patient or authorized representative who has indicated his/her understanding and acceptance.   Dental Advisory Given  Plan Discussed with: CRNA  Anesthesia Plan Comments:         Anesthesia Quick Evaluation

## 2017-09-03 NOTE — Anesthesia Post-op Follow-up Note (Signed)
Anesthesia QCDR form completed.        

## 2017-09-03 NOTE — Op Note (Signed)
09/03/2017  8:40 AM    Molly Brandt  [id number]    Pre-Op Diagnosis:  RIGHT TONGUE CARCINOMA, RIGHT TRUE VOCAL CORD LESION  Post-op Diagnosis: SAME  Procedure:  Excision right oral tongue lesion with closure, Microlaryngoscopy with excision right true vocal cord lesion  Surgeon:  Riley Nearing., MD  Anesthesia:  General Endotracheal  EBL:  minimal  Complications:  None  Findings:  White thickened mucosa of right mid to anterior true vocal cord with mild central granular change. Right oral tongue white thickening of the mucosa, about 2/3 back on lateral tongue, with central thicker area  Procedure: With the patient in a comfortable supine position, general endotracheal anesthesia was induced without difficulty.  At an appropriate level, the table was turned 90 degrees away from Anesthesia.  A clean preparation and draping was performed in the standard fashion.   A tooth guard was placed. Using the Dedo laryngoscope, the oropharynx, hypopharynx and larynx were carefully inspected.  There were no lesions in the hypopharynx or tongue base and the supraglottic larynx was unremarkable.  The vocal cords were inspected.  There was some mild cord edema bilaterally, however on the right cord there was whitish thickening involving the mid to anterior cord with a central area of very mild granularity of the mucosa.  This did not extend to the anterior commissure however.  The patient was placed into suspension.  The mucosa was grasped with cup forceps and microlaryngeal scissors used to excise the lesion down to the underlying vocalis muscle, avoiding injury to the muscle itself.  Bleeding was minimal.  The patient was taken out of suspension and the laryngoscope removed.  The tooth guard was removed and the teeth inspected with no injury.  Retractor was used to open the mouth.  The tongue was grasped and extended, and a 2-0 silk suture passed through the tip of the tongue for use as a  retractor.  The lesion on the right oral tongue was inspected.  There was an area of whitish thickening involving the lateral aspect of the tongue with a more thickened central area.  An ellipse was outlined around the lesion with a 15 blade, approximately 2 x 1.5 cm total size with a margin of healthy mucosa.  The lesion was then excised down to the underlying muscle, taking a thin margin of muscle with the mucosa to ensure complete excision.  There was no evidence of any deeper invasion grossly. Cautery was used to control minor bleeding.  The wound was then closed in an interrupted fashion with 4-0 Vicryl.  At this point the procedure was completed.  Dental status was intact.  The patient was returned to Anesthesia, awakened, extubated, and transferred to PACU in satisfactory condition.   Disposition: To PACU, then discharge home  Plan: Soft, bland diet, advance as tolerated. Take pain medications as prescribed. Follow-up in 3 weeks.  Riley Nearing 09/03/2017 8:40 AM

## 2017-09-04 LAB — SURGICAL PATHOLOGY

## 2017-09-04 NOTE — Anesthesia Postprocedure Evaluation (Signed)
Anesthesia Post Note  Patient: Molly Brandt  Procedure(s) Performed: DIRECT LARYNGOSCOPY WITH EXCISION OF VOCAL CORD LESION (N/A ) EXCISION OF TONGUE LESION (Right )  Patient location during evaluation: PACU Anesthesia Type: General Level of consciousness: awake and alert Pain management: pain level controlled Vital Signs Assessment: post-procedure vital signs reviewed and stable Respiratory status: spontaneous breathing, nonlabored ventilation, respiratory function stable and patient connected to nasal cannula oxygen Cardiovascular status: blood pressure returned to baseline and stable Postop Assessment: no apparent nausea or vomiting Anesthetic complications: no     Last Vitals:  Vitals:   09/03/17 0927 09/03/17 1037  BP: 125/80 124/74  Pulse: 86 72  Resp: 16 16  Temp: (!) 36.3 C   SpO2: 94% 94%    Last Pain:  Vitals:   09/03/17 1037  TempSrc:   PainSc: 2                  [name]

## 2017-09-16 LAB — HM HIV SCREENING LAB: HM HIV Screening: NEGATIVE

## 2017-12-05 ENCOUNTER — Other Ambulatory Visit: Payer: Self-pay | Admitting: Family Medicine

## 2017-12-05 DIAGNOSIS — Z1231 Encounter for screening mammogram for malignant neoplasm of breast: Secondary | ICD-10-CM

## 2017-12-25 ENCOUNTER — Ambulatory Visit
Admission: RE | Admit: 2017-12-25 | Discharge: 2017-12-25 | Disposition: A | Discharge status: Home / Self Care | Payer: Medicaid Other | Source: Ambulatory Visit | Attending: Family Medicine | Admitting: Family Medicine

## 2017-12-25 DIAGNOSIS — Z1231 Encounter for screening mammogram for malignant neoplasm of breast: Secondary | ICD-10-CM | POA: Diagnosis present

## 2018-12-17 ENCOUNTER — Other Ambulatory Visit: Payer: Self-pay | Admitting: Family Medicine

## 2018-12-23 ENCOUNTER — Other Ambulatory Visit: Payer: Self-pay | Admitting: Family Medicine

## 2018-12-23 DIAGNOSIS — Z1231 Encounter for screening mammogram for malignant neoplasm of breast: Secondary | ICD-10-CM

## 2019-01-12 ENCOUNTER — Other Ambulatory Visit: Payer: Self-pay

## 2019-01-12 ENCOUNTER — Ambulatory Visit
Admission: RE | Admit: 2019-01-12 | Discharge: 2019-01-12 | Disposition: A | Discharge status: Home / Self Care | Payer: Medicaid Other | Source: Ambulatory Visit | Attending: Family Medicine | Admitting: Family Medicine

## 2019-01-12 DIAGNOSIS — Z1231 Encounter for screening mammogram for malignant neoplasm of breast: Secondary | ICD-10-CM | POA: Diagnosis not present

## 2019-01-26 DIAGNOSIS — F1911 Other psychoactive substance abuse, in remission: Secondary | ICD-10-CM

## 2019-01-26 DIAGNOSIS — Q369 Cleft lip, unilateral: Secondary | ICD-10-CM

## 2019-01-26 DIAGNOSIS — Z62810 Personal history of physical and sexual abuse in childhood: Secondary | ICD-10-CM

## 2019-01-26 DIAGNOSIS — Z780 Asymptomatic menopausal state: Secondary | ICD-10-CM

## 2019-01-27 ENCOUNTER — Ambulatory Visit: Payer: Medicaid Other | Admitting: Nurse Practitioner

## 2019-01-27 ENCOUNTER — Other Ambulatory Visit: Payer: Self-pay

## 2019-01-27 ENCOUNTER — Ambulatory Visit: Payer: Self-pay

## 2019-01-27 DIAGNOSIS — Z113 Encounter for screening for infections with a predominantly sexual mode of transmission: Secondary | ICD-10-CM

## 2019-01-27 DIAGNOSIS — K739 Chronic hepatitis, unspecified: Secondary | ICD-10-CM

## 2019-01-27 LAB — WET PREP FOR TRICH, YEAST, CLUE
Trichomonas Exam: NEGATIVE
Yeast Exam: NEGATIVE

## 2019-01-27 NOTE — Progress Notes (Signed)
Wet prep results reviewed. Per standing orders. No treatment indicated. Hal Morales, RN

## 2019-01-27 NOTE — Progress Notes (Signed)
In for STD screening Debera Lat, RN
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
## 2019-01-29 ENCOUNTER — Encounter: Payer: Self-pay | Admitting: Nurse Practitioner

## 2019-01-29 DIAGNOSIS — C801 Malignant (primary) neoplasm, unspecified: Secondary | ICD-10-CM | POA: Insufficient documentation

## 2019-01-29 NOTE — Patient Instructions (Signed)
Hepatitis C  Hepatitis C is a viral infection of the liver. It can lead to scarring of the liver (cirrhosis), liver failure, or liver cancer. Hepatitis C may go undetected for months or years because people with the infection may not have symptoms, or they may have only mild symptoms. What are the causes? This condition is caused by the hepatitis C virus (HCV). The virus can spread from person to person (is contagious) through:  Blood.  Childbirth. A woman who has hepatitis C can pass it to her baby during birth.  Bodily fluids, such as breast milk, tears, semen, vaginal fluids, and saliva.  Blood transfusions or organ transplants done in the [place] before 1992. What increases the risk? The following factors may make you more likely to develop this condition:  Having contact with unclean (contaminated) needles or syringes. This may result from: ? Acupuncture. ? Tattoing. ? Body piercing. ? Injecting drugs.  Having unprotected sex with someone who is infected.  Needing treatment to filter your blood (kidney dialysis).  Having HIV (human immunodeficiency virus) or AIDS (acquired immunodeficiency syndrome).  Working in a job that involves contact with blood or bodily fluids, such as health care. What are the signs or symptoms? Symptoms of this condition include:  Fatigue.  Loss of appetite.  Nausea.  Vomiting.  Abdominal pain.  Dark yellow urine.  Yellowish skin and eyes (jaundice).  Itchy skin.  Clay-colored bowel movements.  Joint pain.  Bleeding and bruising easily.  Fluid building up in your stomach (ascites). In some cases, you may not have any symptoms. How is this diagnosed? This condition is diagnosed with:  Blood tests.  Other tests to check how well your liver is functioning. They may include: ? Magnetic resonance elastography (MRE). This imaging test uses MRIs and sound waves to measure liver stiffness. ? Transient elastography. This  imaging test uses ultrasounds to measure liver stiffness. ? Liver biopsy. This test requires taking a small tissue sample from your liver to examine it under a microscope. How is this treated? Your health care provider may perform noninvasive tests or a liver biopsy to help decide the best course of treatment. Treatment may include:  Antiviral medicines and other medicines.  Follow-up treatments every 6-12 months for infections or other liver conditions.  Receiving a donated liver (liver transplant). Follow these instructions at home: Medicines  Take over-the-counter and prescription medicines only as told by your health care provider.  Take your antiviral medicine as told by your health care provider. Do not stop taking the antiviral even if you start to feel better.  Do not take any medicines unless approved by your health care provider, including over-the-counter medicines and birth control pills. Activity  Rest as needed.  Do not have sex unless approved by your health care provider.  Ask your health care provider when you may return to school or work. Eating and drinking   Eat a balanced diet with plenty of fruits and vegetables, whole grains, and lowfat (lean) meats or non-meat proteins (such as beans or tofu).  Drink enough fluids to keep your urine clear or pale yellow.  Do not drink alcohol. General instructions  Do not share toothbrushes, nail clippers, or razors.  Wash your hands frequently with soap and water. If soap and water are not available, use hand sanitizer.  Cover any cuts or open sores on your skin to prevent spreading the virus.  Keep all follow-up visits as told by your health care provider. This is   important. You may need follow-up visits every 6-12 months. How is this prevented? There is no vaccine for hepatitis C. The only way to prevent the disease is to reduce the risk of exposure to the virus. Make sure you:  Wash your hands frequently with  soap and water. If soap and water are not available, use hand sanitizer.  Do not share needles or syringes.  Practice safe sex and use condoms.  Avoid handling blood or bodily fluids without gloves or other protection.  Avoid getting tattoos or piercings in shops or other locations that are not clean. Contact a health care provider if:  You have a fever.  You develop abdominal pain.  You pass dark urine.  You pass clay-colored stools.  You develop joint pain. Get help right away if:  You have increasing fatigue or weakness.  You lose your appetite.  You cannot eat or drink without vomiting.  You develop jaundice or your jaundice gets worse.  You bruise or bleed easily. Summary  Hepatitis C is a viral infection of the liver. It can lead to scarring of the liver (cirrhosis), liver failure, or liver cancer.  The hepatitis C virus (HCV) causes this condition. The virus can pass from person to person (is contagious).  You should not take any medicines unless approved by your health care provider. This includes over-the-counter medicines and birth control pills. This information is not intended to replace advice given to you by your health care provider. Make sure you discuss any questions you have with your health care provider. Document Released: 05/24/2000 Document Revised: 05/09/2017 Document Reviewed: 07/02/2016 Elsevier Patient Education  2020 Elsevier Inc.  

## 2019-02-01 LAB — GONOCOCCUS CULTURE

## 2020-02-04 ENCOUNTER — Ambulatory Visit: Payer: Medicaid Other

## 2020-02-07 ENCOUNTER — Ambulatory Visit: Payer: Medicaid Other | Attending: Internal Medicine

## 2020-02-07 DIAGNOSIS — Z23 Encounter for immunization: Secondary | ICD-10-CM

## 2020-02-07 NOTE — Progress Notes (Signed)
   Covid-19 Vaccination Clinic  Name:  Molly Brandt    MRN: [id number] DOB: 03-19-1966  02/07/2020  [name] was observed post Covid-19 immunization for 15 minutes without incident. She was provided with Vaccine Information Sheet and instruction to access the V-Safe system.   [name] was instructed to call 911 with any severe reactions post vaccine: [place] Kitchen Difficulty breathing  . Swelling of face and throat  . A fast heartbeat  . A bad rash all over body  . Dizziness and weakness   Immunizations Administered    Name Date Dose VIS Date Route   Pfizer COVID-19 Vaccine 02/07/2020  9:13 AM 0.3 mL 08/04/2018 Intramuscular   Manufacturer: [employer]   Lot: [id number]   [place]: [id number]

## 2020-02-25 ENCOUNTER — Ambulatory Visit: Payer: Medicaid Other

## 2020-02-28 ENCOUNTER — Ambulatory Visit: Payer: Medicaid Other

## 2020-03-06 ENCOUNTER — Ambulatory Visit: Payer: Medicaid Other

## 2020-03-13 ENCOUNTER — Ambulatory Visit: Payer: Medicaid Other

## 2020-08-15 ENCOUNTER — Other Ambulatory Visit: Payer: Self-pay | Admitting: Family Medicine

## 2020-08-15 DIAGNOSIS — Z1231 Encounter for screening mammogram for malignant neoplasm of breast: Secondary | ICD-10-CM

## 2020-10-17 DIAGNOSIS — I10 Essential (primary) hypertension: Secondary | ICD-10-CM | POA: Insufficient documentation

## 2021-03-13 NOTE — Progress Notes (Incomplete)
03/13/21 11:25 AM   [place] [employer] Donalda Ewings 1965-11-10 [id number]  Referring provider:  Letta Median, MD [place],  [place] [address] No chief complaint on file.    HPI: Molly Brandt is a 55 y.o.female who presents today for further evaluation of mixed incontinence.   She was referred by [facility].         PMH: Past Medical History:  Diagnosis Date   Cancer ([place]) 2019   oral   Chronic gastritis 04/11/2015   COPD (chronic obstructive pulmonary disease) (HCC)    Dyspnea    Ectopic pregnancy    GERD (gastroesophageal reflux disease)    Hepatitis    Hepatitis C   Pneumonia     Surgical History: Past Surgical History:  Procedure Laterality Date   COLONOSCOPY WITH PROPOFOL N/A 04/11/2015   Procedure: COLONOSCOPY WITH PROPOFOL;  Surgeon: Lollie Sails, MD;  Location: [facility] ENDOSCOPY;  Service: Endoscopy;  Laterality: N/A;   DIAGNOSTIC LAPAROSCOPY     DIRECT LARYNGOSCOPY N/A 09/03/2017   Procedure: DIRECT LARYNGOSCOPY WITH EXCISION OF VOCAL CORD LESION;  Surgeon: Clyde Canterbury, MD;  Location: [facility] ORS;  Service: ENT;  Laterality: N/A;   ESOPHAGOGASTRODUODENOSCOPY (EGD) WITH PROPOFOL N/A 04/11/2015   Procedure: ESOPHAGOGASTRODUODENOSCOPY (EGD) WITH PROPOFOL;  Surgeon: Lollie Sails, MD;  Location: [facility] ENDOSCOPY;  Service: Endoscopy;  Laterality: N/A;   EXCISION OF TONGUE LESION Right 09/03/2017   Procedure: EXCISION OF TONGUE LESION;  Surgeon: Clyde Canterbury, MD;  Location: [facility] ORS;  Service: ENT;  Laterality: Right;   LAPAROSCOPIC UNILATERAL SALPINGECTOMY Left    MOUTH SURGERY      Home Medications:  Allergies as of 03/14/2021       Reactions   Naproxen    Stomach pain   Tramadol Nausea And Vomiting        Medication List        Accurate as of March 13, 2021 11:25 AM. If you have any questions, ask your nurse or doctor.          albuterol 108 (90 Base) MCG/ACT inhaler Commonly known  as: VENTOLIN HFA Inhale 2 puffs into the lungs every 6 (six) hours as needed for wheezing or shortness of breath.   albuterol (2.5 MG/3ML) 0.083% nebulizer solution Commonly known as: PROVENTIL Take 2.5 mg by nebulization every 6 (six) hours as needed for wheezing or shortness of breath.   amoxicillin 400 MG/5ML suspension Commonly known as: AMOXIL 2-1/4 teaspoons by mouth twice daily for 10 days   aspirin EC 325 MG tablet Take 325 mg by mouth daily as needed for moderate pain.   cetirizine 10 MG tablet Commonly known as: ZYRTEC Take 10 mg by mouth daily as needed for allergies.   cyclobenzaprine 10 MG tablet Commonly known as: FLEXERIL Take 10 mg by mouth 3 (three) times daily as needed for muscle spasms.   Fluticasone-Salmeterol 500-50 MCG/DOSE Aepb Commonly known as: ADVAIR Inhale 1 puff into the lungs 2 (two) times daily.   HYDROcodone-acetaminophen 7.5-325 mg/15 ml solution Commonly known as: HYCET 10-15 cc by mouth every 4-6 hours as needed for pain   mometasone 50 MCG/ACT nasal spray Commonly known as: NASONEX Place 2 sprays into the nose daily.   omeprazole 40 MG capsule Commonly known as: PRILOSEC Take 40 mg by mouth 2 (two) times daily.   OXYGEN Inhale 2.5 L into the lungs daily.   tiotropium 18 MCG inhalation capsule Commonly known as: SPIRIVA Place 18 mcg into  inhaler and inhale every morning.        Allergies:  Allergies  Allergen Reactions   Naproxen     Stomach pain   Tramadol Nausea And Vomiting    Family History: Family History  Problem Relation Age of Onset   Cancer Father    Stroke Maternal Grandfather    Obesity Paternal Grandmother    Breast cancer Neg Hx     Social History:  reports that she has been smoking cigarettes. She has a 25.00 pack-year smoking history. She has never used smokeless tobacco. She reports that she does not drink alcohol and does not use drugs.   Physical Exam: There were no vitals taken for this visit.   Constitutional:  Alert and oriented, No acute distress. HEENT: [place] AT, moist mucus membranes.  Trachea midline, no masses. Cardiovascular: No clubbing, cyanosis, or edema. Respiratory: Normal respiratory effort, no increased work of breathing. Skin: No rashes, bruises or suspicious lesions. Neurologic: Grossly intact, no focal deficits, moving all 4 extremities. Psychiatric: Normal mood and affect.  Laboratory Data:  Lab Results  Component Value Date   CREATININE 0.58 08/27/2017      Urinalysis   Pertinent Imaging:    Assessment & Plan:     No follow-ups on file.  I,Kailey Littlejohn,acting as a Education administrator for Hollice Espy, MD.,have documented all relevant documentation on the behalf of Hollice Espy, MD,as directed by  Hollice Espy, MD while in the presence of Hollice Espy, [place] [address] [place], [place] [address] [phone]

## 2021-03-14 ENCOUNTER — Ambulatory Visit: Payer: Self-pay | Admitting: Urology

## 2021-03-26 NOTE — Progress Notes (Incomplete)
03/26/21 12:17 PM   Molly Brandt 04/07/1966 [id number]  Referring provider:  Letta Median, MD [place],  [place] [address] No chief complaint on file.    HPI: Molly Brandt is a 55 y.o.female who presents today for further evaluation of mixed urinary incontinence.   She was referred by [facility].   She is a former smoker.         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   Chronic gastritis 04/11/2015   COPD (chronic obstructive pulmonary disease) (HCC)    Dyspnea    Ectopic pregnancy    GERD (gastroesophageal reflux disease)    Hepatitis    Hepatitis C   Pneumonia     Surgical History: Past Surgical History:  Procedure Laterality Date   COLONOSCOPY WITH PROPOFOL N/A 04/11/2015   Procedure: COLONOSCOPY WITH PROPOFOL;  Surgeon: Lollie Sails, MD;  Location: [facility] ENDOSCOPY;  Service: Endoscopy;  Laterality: N/A;   DIAGNOSTIC LAPAROSCOPY     DIRECT LARYNGOSCOPY N/A 09/03/2017   Procedure: DIRECT LARYNGOSCOPY WITH EXCISION OF VOCAL CORD LESION;  Surgeon: Clyde Canterbury, MD;  Location: [facility] ORS;  Service: ENT;  Laterality: N/A;   ESOPHAGOGASTRODUODENOSCOPY (EGD) WITH PROPOFOL N/A 04/11/2015   Procedure: ESOPHAGOGASTRODUODENOSCOPY (EGD) WITH PROPOFOL;  Surgeon: Lollie Sails, MD;  [facility] END[facility]COPY;  Service: Endoscopy;  Laterality: N/A;   EXCISION OF TONGUE LESION Right 09/03/2017   Procedure: EXCISION OF TONGUE LESION;  Surgeon: Clyde Canterbury, MD;  Location: [facility] ORS;  Service: ENT;  Laterality: Right;   LAPAROSCOPIC UNILATERAL SALPINGECTOMY Left    MOUTH SURGERY      Home Medications:  Allergies as of 03/27/2021       Reactions   Naproxen    Stomach pain   Tramadol Nausea And Vomiting        Medication List        Accurate as of March 26, 2021 12:17 PM. If you have any questions, ask your nurse or doctor.          albuterol 108 (90  Base) MCG/ACT inhaler Commonly known as: VENTOLIN HFA Inhale 2 puffs into the lungs every 6 (six) hours as needed for wheezing or shortness of breath.   albuterol (2.5 MG/3ML) 0.083% nebulizer solution Commonly known as: PROVENTIL Take 2.5 mg by nebulization every 6 (six) hours as needed for wheezing or shortness of breath.   amoxicillin 400 MG/5ML suspension Commonly known as: AMOXIL 2-1/4 teaspoons by mouth twice daily for 10 days   aspirin EC 325 MG tablet Take 325 mg by mouth daily as needed for moderate pain.   cetirizine 10 MG tablet Commonly known as: ZYRTEC Take 10 mg by mouth daily as needed for allergies.   cyclobenzaprine 10 MG tablet Commonly known as: FLEXERIL Take 10 mg by mouth 3 (three) times daily as needed for muscle spasms.   Fluticasone-Salmeterol 500-50 MCG/DOSE Aepb Commonly known as: ADVAIR Inhale 1 puff into the lungs 2 (two) times daily.   HYDROcodone-acetaminophen 7.5-325 mg/15 ml solution Commonly known as: HYCET 10-15 cc by mouth every 4-6 hours as needed for pain   mometasone 50 MCG/ACT nasal spray Commonly known as: NASONEX Place 2 sprays into the nose daily.   omeprazole 40 MG capsule Commonly known as: PRILOSEC Take 40 mg by mouth 2 (two) times daily.   OXYGEN Inhale 2.5 L into the lungs daily.   tiotropium 18 MCG inhalation capsule  Commonly known as: SPIRIVA Place 18 mcg into inhaler and inhale every morning.        Allergies:  Allergies  Allergen Reactions   Naproxen     Stomach pain   Tramadol Nausea And Vomiting    Family History: Family History  Problem Relation Age of Onset   Cancer Father    Stroke Maternal Grandfather    Obesity Paternal Grandmother    Breast cancer Neg Hx     Social History:  reports that she has been smoking cigarettes. She has a 25.00 pack-year smoking history. She has never used smokeless tobacco. She reports that she does not drink alcohol and does not use drugs.   Physical Exam: There  were no vitals taken for this visit.  Constitutional:  Alert and oriented, No acute distress. HEENT: [place] AT, moist mucus membranes.  Trachea midline, no masses. Cardiovascular: No clubbing, cyanosis, or edema. Respiratory: Normal respiratory effort, no increased work of breathing. Skin: No rashes, bruises or suspicious lesions. Neurologic: Grossly intact, no focal deficits, moving all 4 extremities. Psychiatric: Normal mood and affect.  Laboratory Data:  Lab Results  Component Value Date   CREATININE 0.58 08/27/2017     Urinalysis   Pertinent Imaging:    Assessment & Plan:     No follow-ups on file.  I,Kailey Littlejohn,acting as a Education administrator for Hollice Espy, MD.,have documented all relevant documentation on the behalf of Hollice Espy, MD,as directed by  Hollice Espy, MD while in the presence of Hollice Espy, [place] [address] [place], [place] [address] [phone]

## 2021-03-27 ENCOUNTER — Ambulatory Visit: Payer: Self-pay | Admitting: Urology

## 2021-04-09 NOTE — Progress Notes (Incomplete)
04/09/21 11:53 AM   [place] Donalda Ewings [birth year] [id number]  Referring provider:  Letta Median, MD [place],  [place] [address] No chief complaint on file.    HPI: Molly Brandt is a 55 y.o.female who presents today for further evaluation of mixed urinary incontinence.   She was referred to urology previously on 08/14/2020 for mixed urinary incontinence. She did not follow-up with urology she was referred back on 02/23/2021 for mixed incontinence.      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   Chronic gastritis 04/11/2015   COPD (chronic obstructive pulmonary disease) (HCC)    Dyspnea    Ectopic pregnancy    GERD (gastroesophageal reflux disease)    Hepatitis    Hepatitis C   Pneumonia     Surgical History: Past Surgical History:  Procedure Laterality Date   COLONOSCOPY WITH PROPOFOL N/A 04/11/2015   Procedure: COLONOSCOPY WITH PROPOFOL;  Surgeon: Lollie Sails, MD;  Location: [facility] ENDOSCOPY;  Service: Endoscopy;  Laterality: N/A;   DIAGNOSTIC LAPAROSCOPY     DIRECT LARYNGOSCOPY N/A 09/03/2017   Procedure: DIRECT LARYNGOSCOPY WITH EXCISION OF VOCAL CORD LESION;  Surgeon: Clyde Canterbury, MD;  Location: [facility] ORS;  Service: ENT;  Laterality: N/A;   ESOPHAGOGASTRODUODENOSCOPY (EGD) WITH PROPOFOL N/A 04/11/2015   Procedure: ESOPHAGOGASTRODUODENOSCOPY (EGD) WITH PROPOFOL;  Surgeon: Lollie Sails, MD;  Location: [facility] ENDOSCOPY;  Service: Endoscopy;  Laterality: N/A;   EXCISION OF TONGUE LESION Right 09/03/2017   Procedure: EXCISION OF TONGUE LESION;  Surgeon: Clyde Canterbury, MD;  Location: [facility] ORS;  Service: ENT;  Laterality: Right;   LAPAROSCOPIC UNILATERAL SALPINGECTOMY Left    MOUTH SURGERY      Home Medications:  Allergies as of 04/10/2021       Reactions   Naproxen    Stomach pain   Tramadol Nausea And Vomiting        Medication List        Accurate as of April 09, 2021 11:53 AM.  If you have any questions, ask your nurse or doctor.          albuterol 108 (90 Base) MCG/ACT inhaler Commonly known as: VENTOLIN HFA Inhale 2 puffs into the lungs every 6 (six) hours as needed for wheezing or shortness of breath.   albuterol (2.5 MG/3ML) 0.083% nebulizer solution Commonly known as: PROVENTIL Take 2.5 mg by nebulization every 6 (six) hours as needed for wheezing or shortness of breath.   amoxicillin 400 MG/5ML suspension Commonly known as: AMOXIL 2-1/4 teaspoons by mouth twice daily for 10 days   aspirin EC 325 MG tablet Take 325 mg by mouth daily as needed for moderate pain.   cetirizine 10 MG tablet Commonly known as: ZYRTEC Take 10 mg by mouth daily as needed for allergies.   cyclobenzaprine 10 MG tablet Commonly known as: FLEXERIL Take 10 mg by mouth 3 (three) times daily as needed for muscle spasms.   Fluticasone-Salmeterol 500-50 MCG/DOSE Aepb Commonly known as: ADVAIR Inhale 1 puff into the lungs 2 (two) times daily.   HYDROcodone-acetaminophen 7.5-325 mg/15 ml solution Commonly known as: HYCET 10-15 cc by mouth every 4-6 hours as needed for pain   mometasone 50 MCG/ACT nasal spray Commonly known as: NASONEX Place 2 sprays into the nose daily.   omeprazole 40 MG capsule Commonly known as: PRILOSEC Take 40 mg by mouth 2 (two) times daily.   OXYGEN Inhale 2.5 L into the lungs  daily.   tiotropium 18 MCG inhalation capsule Commonly known as: SPIRIVA Place 18 mcg into inhaler and inhale every morning.        Allergies:  Allergies  Allergen Reactions   Naproxen     Stomach pain   Tramadol Nausea And Vomiting    Family History: Family History  Problem Relation Age of Onset   Cancer Father    Stroke Maternal Grandfather    Obesity Paternal Grandmother    Breast cancer Neg Hx     Social History:  reports that she has been smoking cigarettes. She has a 25.00 pack-year smoking history. She has never used smokeless tobacco. She  reports that she does not drink alcohol and does not use drugs.   Physical Exam: There were no vitals taken for this visit.  Constitutional:  Alert and oriented, No acute distress. HEENT: [place] AT, moist mucus membranes.  Trachea midline, no masses. Cardiovascular: No clubbing, cyanosis, or edema. Respiratory: Normal respiratory effort, no increased work of breathing. Skin: No rashes, bruises or suspicious lesions. Neurologic: Grossly intact, no focal deficits, moving all 4 extremities. Psychiatric: Normal mood and affect.  Laboratory Data:  Lab Results  Component Value Date   CREATININE 0.58 08/27/2017     Urinalysis   Pertinent Imaging:    Assessment & Plan:     No follow-ups on file.  I,Kailey Littlejohn,acting as a Education administrator for Hollice Espy, MD.,have documented all relevant documentation on the behalf of Hollice Espy, MD,as directed by  Hollice Espy, MD while in the presence of Hollice Espy, [place] [address] [place], [place] [address] [phone]

## 2021-04-10 ENCOUNTER — Ambulatory Visit: Payer: Self-pay | Admitting: Urology

## 2021-04-10 ENCOUNTER — Encounter: Payer: Self-pay | Admitting: Urology

## 2021-04-23 NOTE — Progress Notes (Incomplete)
04/23/21 5:29 PM   Molly Brandt [birth year] [id number]  Referring provider:  [facility], [place],  [place] [address] No chief complaint on file.    HPI: Molly Brandt is a 55 y.o.female who presents today for further evaluation of mixed incontinence.   She was seen by Dr. Rutherford Guys with pain during sex and urinary accidents to which she was referred to urology.       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   Chronic gastritis 04/11/2015   COPD (chronic obstructive pulmonary disease) (HCC)    Dyspnea    Ectopic pregnancy    GERD (gastroesophageal reflux disease)    Hepatitis    Hepatitis C   Pneumonia     Surgical History: Past Surgical History:  Procedure Laterality Date   COLONOSCOPY WITH PROPOFOL N/A 04/11/2015   Procedure: COLONOSCOPY WITH PROPOFOL;  Surgeon: Lollie Sails, MD;  Location: [facility] ENDOSCOPY;  Service: Endoscopy;  Laterality: N/A;   DIAGNOSTIC LAPAROSCOPY     DIRECT LARYNGOSCOPY N/A 09/03/2017   Procedure: DIRECT LARYNGOSCOPY WITH EXCISION OF VOCAL CORD LESION;  Surgeon: Clyde Canterbury, MD;  Location: [facility] ORS;  Service: ENT;  Laterality: N/A;   ESOPHAGOGASTRODUODENOSCOPY (EGD) WITH PROPOFOL N/A 04/11/2015   Procedure: ESOPHAGOGASTRODUODENOSCOPY (EGD) WITH PROPOFOL;  Surgeon: Lollie Sails, MD;  Location[facility] ENDOSCOPY;  Service: Endoscopy;  Laterality: N/A;   EXCISION OF TONGUE LESION Right 09/03/2017   Procedure: EXCISION OF TONGUE LESION;  Surgeon: Clyde Canterbury, MD;  Location: [facility] ORS;  Service: ENT;  Laterality: Right;   LAPAROSCOPIC UNILATERAL SALPINGECTOMY Left    MOUTH SURGERY      Home Medications:  Allergies as of 04/24/2021       Reactions   Naproxen    Stomach pain   Tramadol Nausea And Vomiting        Medication List        Accurate as of April 23, 2021  5:29 PM. If you have any questions, ask your nurse or doctor.           albuterol 108 (90 Base) MCG/ACT inhaler Commonly known as: VENTOLIN HFA Inhale 2 puffs into the lungs every 6 (six) hours as needed for wheezing or shortness of breath.   albuterol (2.5 MG/3ML) 0.083% nebulizer solution Commonly known as: PROVENTIL Take 2.5 mg by nebulization every 6 (six) hours as needed for wheezing or shortness of breath.   amoxicillin 400 MG/5ML suspension Commonly known as: AMOXIL 2-1/4 teaspoons by mouth twice daily for 10 days   aspirin EC 325 MG tablet Take 325 mg by mouth daily as needed for moderate pain.   cetirizine 10 MG tablet Commonly known as: ZYRTEC Take 10 mg by mouth daily as needed for allergies.   cyclobenzaprine 10 MG tablet Commonly known as: FLEXERIL Take 10 mg by mouth 3 (three) times daily as needed for muscle spasms.   Fluticasone-Salmeterol 500-50 MCG/DOSE Aepb Commonly known as: ADVAIR Inhale 1 puff into the lungs 2 (two) times daily.   HYDROcodone-acetaminophen 7.5-325 mg/15 ml solution Commonly known as: HYCET 10-15 cc by mouth every 4-6 hours as needed for pain   mometasone 50 MCG/ACT nasal spray Commonly known as: NASONEX Place 2 sprays into the nose daily.   omeprazole 40 MG capsule Commonly known as: PRILOSEC Take 40 mg by mouth 2 (two) times daily.   OXYGEN Inhale 2.5 L into the lungs daily.   tiotropium  18 MCG inhalation capsule Commonly known as: SPIRIVA Place 18 mcg into inhaler and inhale every morning.        Allergies:  Allergies  Allergen Reactions   Naproxen     Stomach pain   Tramadol Nausea And Vomiting    Family History: Family History  Problem Relation Age of Onset   Cancer Father    Stroke Maternal Grandfather    Obesity Paternal Grandmother    Breast cancer Neg Hx     Social History:  reports that she has been smoking cigarettes. She has a 25.00 pack-year smoking history. She has never used smokeless tobacco. She reports that she does not drink alcohol and does not use  drugs.   Physical Exam: There were no vitals taken for this visit.  Constitutional:  Alert and oriented, No acute distress. HEENT: [place] AT, moist mucus membranes.  Trachea midline, no masses. Cardiovascular: No clubbing, cyanosis, or edema. Respiratory: Normal respiratory effort, no increased work of breathing. Skin: No rashes, bruises or suspicious lesions. Neurologic: Grossly intact, no focal deficits, moving all 4 extremities. Psychiatric: Normal mood and affect.  Laboratory Data:  Lab Results  Component Value Date   CREATININE 0.58 08/27/2017   Urinalysis   Pertinent Imaging:   Assessment & Plan:     No follow-ups on file.  I,Kailey Littlejohn,acting as a Education administrator for Hollice Espy, MD.,have documented all relevant documentation on the behalf of Hollice Espy, MD,as directed by  Hollice Espy, MD while in the presence of Hollice Espy, [place] [address] [place], [place] [address] [phone]

## 2021-04-24 ENCOUNTER — Encounter: Payer: Self-pay | Admitting: Urology

## 2021-04-24 ENCOUNTER — Ambulatory Visit: Payer: Medicaid Other | Admitting: Urology

## 2021-05-07 NOTE — Progress Notes (Signed)
05/08/21 2:50 PM   Molly Brandt 12/05/1965 [id number]  Referring provider:  [facility], [place],  [place] [address] Chief Complaint  Patient presents with   Urinary Incontinence     HPI: Molly Brandt is a 55 y.o.female who presents for further evaluation of urinary incontinence.   Today, she is tearful.  She reports that she has had a lot of issues in her personal life including multiple family members living with her currently with lots of drama.  She is also been having trouble with her vision and is very worried about this today as well.  Today, she reports that she has been having worsening urinary symptoms over the past several years.  This includes urinary urgency frequency and inability to hold her urine/incontinence episodes when she cannot get to the bathroom on time.  These seem to be progressing somewhat.  She denies any dysuria or gross hematuria.  No UTIs.  She reports that she has been experiencing leakage during laughing, coughing, and sneezing.   No issues with constipation.  She will occasionally have fecal incontinence with sneezing.  She is sexually active and no issues with intercourse.  No vaginal symptoms or bulging.  She is never tried any medications for symptoms.   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   Chronic gastritis 04/11/2015   COPD (chronic obstructive pulmonary disease) (HCC)    Dyspnea    Ectopic pregnancy    GERD (gastroesophageal reflux disease)    Hepatitis    Hepatitis C   Pneumonia     Surgical History: Past Surgical History:  Procedure Laterality Date   COLONOSCOPY WITH PROPOFOL N/A 04/11/2015   Procedure: COLONOSCOPY WITH PROPOFOL;  Surgeon: Lollie Sails, MD;  Location: [facility] ENDOSCOPY;  Service: Endoscopy;  Laterality: N/A;   DIAGNOSTIC LAPAROSCOPY     DIRECT LARYNGOSCOPY N/A 09/03/2017   Procedure: DIRECT LARYNGOSCOPY  WITH EXCISION OF VOCAL CORD LESION;  Surgeon: Clyde Canterbury, MD;  Location: [facility] ORS;  Service: ENT;  Laterality: N/A;   ESOPHAGOGASTRODUODENOSCOPY (EGD) WITH PROPOFOL N/A 04/11/2015   Procedure: ESOPHAGOGASTRODUODENOSCOPY (EGD) WITH PROPOFOL;  Surgeon: Lollie Sails, MD;  Location: [facility] ENDOSCOPY;  Service: Endoscopy;  Laterality: N/A;   EXCISION OF TONGUE LESION Right 09/03/2017   Procedure: EXCISION OF TONGUE LESION;  Surgeon: Clyde Canterbury, MD;  Location: [facility] ORS;  Service: ENT;  Laterality: Right;   LAPAROSCOPIC UNILATERAL SALPINGECTOMY Left    MOUTH SURGERY      Home Medications:  Allergies as of 05/08/2021       Reactions   Naproxen    Stomach pain   Tramadol Nausea And Vomiting        Medication List        Accurate as of May 08, 2021  2:50 PM. If you have any questions, ask your nurse or doctor.          STOP taking these medications    HYDROcodone-acetaminophen 7.5-325 mg/15 ml solution Commonly known as: HYCET Stopped by: Hollice Espy, MD       TAKE these medications    albuterol 108 (90 Base) MCG/ACT inhaler Commonly known as: VENTOLIN HFA Inhale 2 puffs into the lungs every 6 (six) hours as needed for wheezing or shortness of breath.   albuterol (2.5 MG/3ML) 0.083% nebulizer solution Commonly known as: PROVENTIL Take 2.5 mg by nebulization every 6 (six) hours as needed for wheezing or shortness of  breath.   amoxicillin 400 MG/5ML suspension Commonly known as: AMOXIL 2-1/4 teaspoons by mouth twice daily for 10 days   aspirin EC 325 MG tablet Take 325 mg by mouth daily as needed for moderate pain.   cetirizine 10 MG tablet Commonly known as: ZYRTEC Take 10 mg by mouth daily as needed for allergies.   cyclobenzaprine 10 MG tablet Commonly known as: FLEXERIL Take 10 mg by mouth 3 (three) times daily as needed for muscle spasms.   Fluticasone-Salmeterol 500-50 MCG/DOSE Aepb Commonly known as: ADVAIR Inhale 1 puff into the lungs 2  (two) times daily.   mometasone 50 MCG/ACT nasal spray Commonly known as: NASONEX Place 2 sprays into the nose daily.   omeprazole 40 MG capsule Commonly known as: PRILOSEC Take 40 mg by mouth 2 (two) times daily.   oxybutynin 10 MG 24 hr tablet Commonly known as: DITROPAN-XL Take 1 tablet (10 mg total) by mouth daily. Started by: Hollice Espy, MD   OXYGEN Inhale 2.5 L into the lungs daily.   tiotropium 18 MCG inhalation capsule Commonly known as: SPIRIVA Place 18 mcg into inhaler and inhale every morning.        Allergies:  Allergies  Allergen Reactions   Naproxen     Stomach pain   Tramadol Nausea And Vomiting    Family History: Family History  Problem Relation Age of Onset   Cancer Father    Stroke Maternal Grandfather    Obesity Paternal Grandmother    Breast cancer Neg Hx     Social History:  reports that she has been smoking cigarettes. She has a 25.00 pack-year smoking history. She has never used smokeless tobacco. She reports that she does not drink alcohol and does not use drugs.   Physical Exam: BP (!) 156/88   Pulse 86   Ht 5\' 3"  (1.6 m)   Wt 205 lb (93 kg)   BMI 36.31 kg/m   Constitutional:  Alert and oriented, No acute distress. HEENT: [place] AT, moist mucus membranes.  Trachea midline, no masses. Cardiovascular: No clubbing, cyanosis, or edema. Respiratory: Normal respiratory effort, no increased work of breathing. Skin: No rashes, bruises or suspicious lesions. Neurologic: Grossly intact, no focal deficits, moving all 4 extremities. Psychiatric: Normal mood and affect.  Laboratory Data:  Lab Results  Component Value Date   CREATININE 0.58 08/27/2017    Urinalysis Results for orders placed or performed in visit on 05/08/21  Microscopic Examination   Urine  Result Value Ref Range   WBC, UA 0-5 0 - 5 /hpf   RBC None seen 0 - 2 /hpf   Epithelial Cells (non renal) 0-10 0 - 10 /hpf   Casts Present (A) None seen /lpf   Cast Type  Hyaline casts N/A   Mucus, UA Present (A) Not Estab.   Bacteria, UA None seen None seen/Few  Urinalysis, Complete  Result Value Ref Range   Specific Gravity, UA 1.025 1.005 - 1.030   pH, UA 5.5 5.0 - 7.5   Color, UA Orange Yellow   Appearance Ur Hazy (A) Clear   Leukocytes,UA Negative Negative   Protein,UA 1+ (A) Negative/Trace   Glucose, UA Trace (A) Negative   Ketones, UA Trace (A) Negative   RBC, UA Negative Negative   Bilirubin, UA Negative Negative   Urobilinogen, Ur 1.0 0.2 - 1.0 mg/dL   Nitrite, UA Negative Negative   Microscopic Examination See below:   Bladder Scan (Post Void Residual) in office  Result Value Ref Range  Scan Result 10     Assessment & Plan:    Mixed incontinence -Adequate emptying with negative urinalysis, no concern for underlying infection or underlying bladder pathology at this time - will treat her OAB aggressively as this seems to be the biggest bother currently -given oxybutynin 10 ml XL; as well side effects of the medication were also discussed including dry eyes dry mouth and constipation being the most common - Discussed behavioral modification today as well  F/u 3 months with PA for PVR / reassessment  I,Kailey Littlejohn,acting as a scribe for Hollice Espy, MD.,have documented all relevant documentation on the behalf of Hollice Espy, MD,as directed by  Hollice Espy, MD while in the presence of Hollice Espy, MD.  I have reviewed the above documentation for accuracy and completeness, and I agree with the above.   Hollice Espy, MD   [facility] [address] [place], [place] [address] [phone]

## 2021-05-08 ENCOUNTER — Encounter: Payer: Self-pay | Admitting: Urology

## 2021-05-08 ENCOUNTER — Other Ambulatory Visit: Payer: Self-pay

## 2021-05-08 ENCOUNTER — Ambulatory Visit (INDEPENDENT_AMBULATORY_CARE_PROVIDER_SITE_OTHER): Payer: Medicaid Other | Admitting: Urology

## 2021-05-08 VITALS — BP 156/88 | HR 86 | Ht 63.0 in | Wt 205.0 lb

## 2021-05-08 DIAGNOSIS — R32 Unspecified urinary incontinence: Secondary | ICD-10-CM

## 2021-05-08 LAB — MICROSCOPIC EXAMINATION
Bacteria, UA: NONE SEEN
RBC: NONE SEEN /hpf (ref 0–2)

## 2021-05-08 LAB — URINALYSIS, COMPLETE
Bilirubin, UA: NEGATIVE
Leukocytes,UA: NEGATIVE
Nitrite, UA: NEGATIVE
RBC, UA: NEGATIVE
Specific Gravity, UA: 1.025 (ref 1.005–1.030)
Urobilinogen, Ur: 1 mg/dL (ref 0.2–1.0)
pH, UA: 5.5 (ref 5.0–7.5)

## 2021-05-08 LAB — BLADDER SCAN AMB NON-IMAGING: Scan Result: 10

## 2021-05-08 MED ORDER — OXYBUTYNIN CHLORIDE ER 10 MG PO TB24: 10.0000 mg | ORAL_TABLET | Freq: Every day | ORAL | 11 refills | Status: AC

## 2021-06-17 IMAGING — MG DIGITAL SCREENING BILATERAL MAMMOGRAM WITH TOMO AND CAD
8 series · 8 of 24 positions shown · non-contrast
Comparison: Previous exam(s).

CLINICAL DATA: Screening.

EXAM:
DIGITAL SCREENING BILATERAL MAMMOGRAM WITH TOMO AND CAD

[R MLO synth-2D]
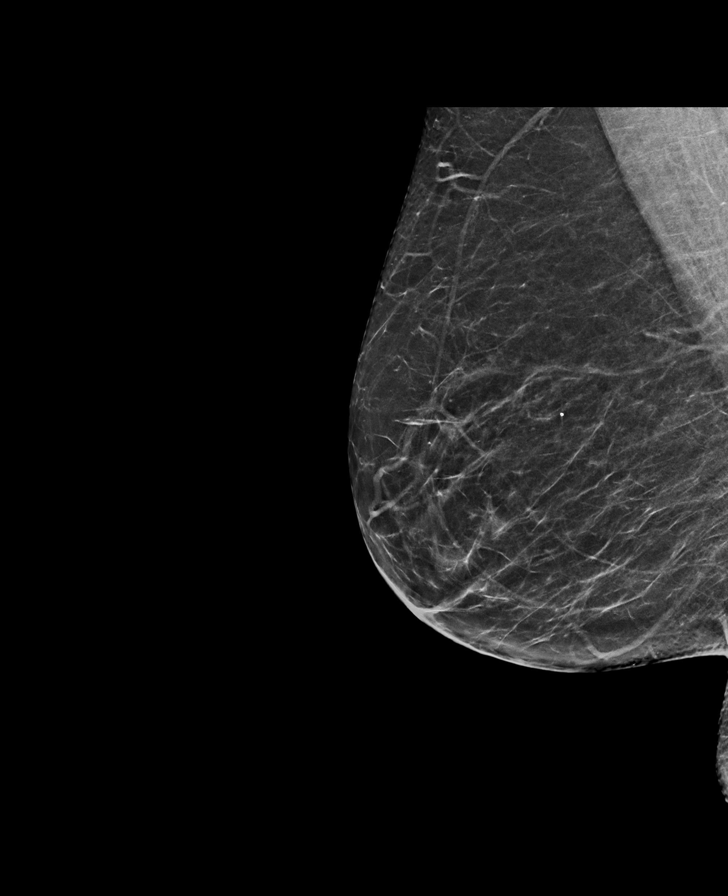

[L CC synth-2D]
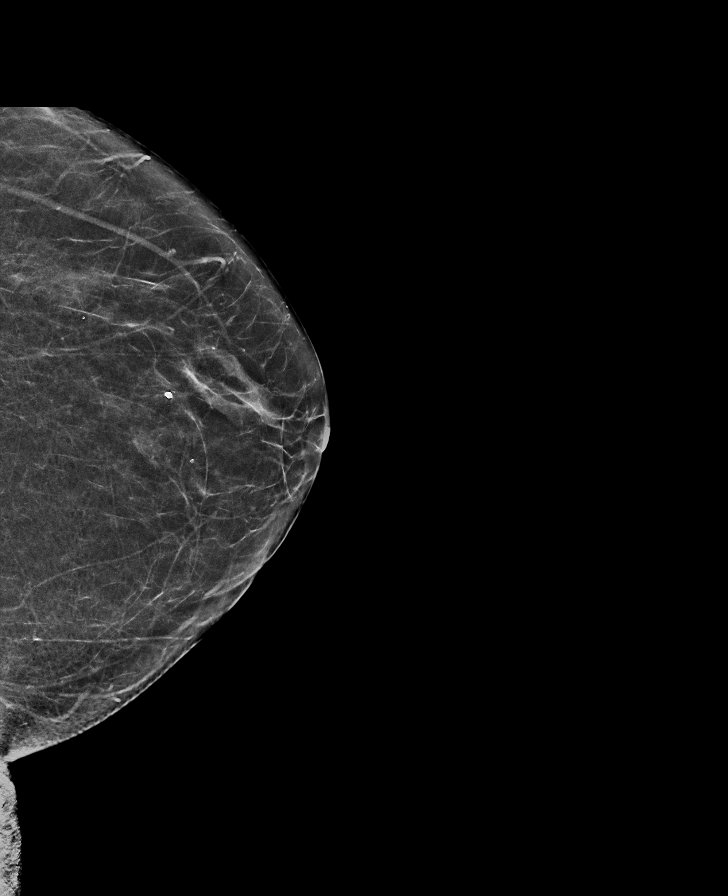

[L MLO synth-2D]
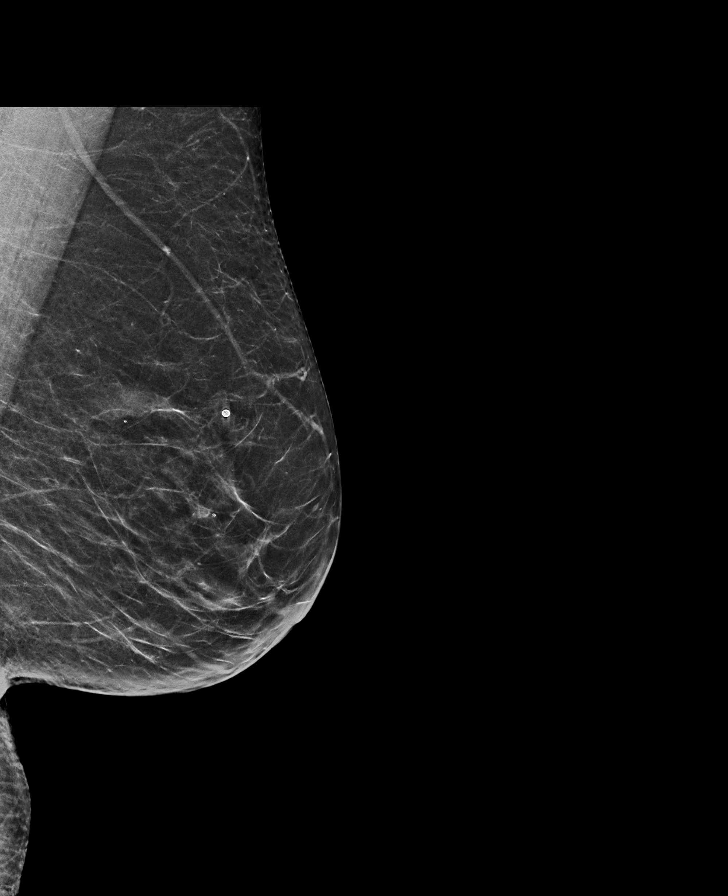

[R CC synth-2D]
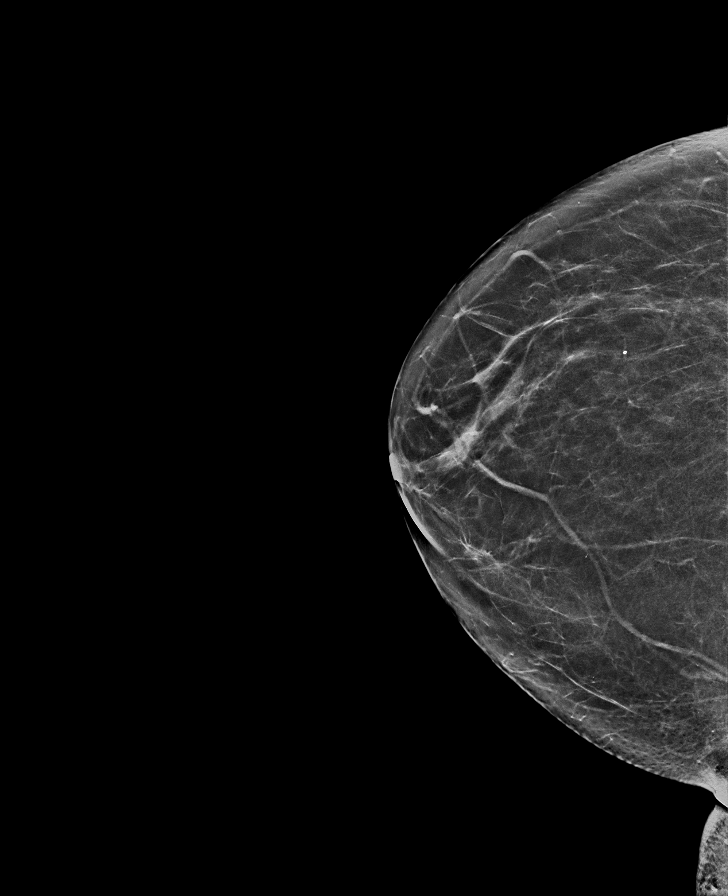

[R MLO tomo · tomo slice 34/67.0]
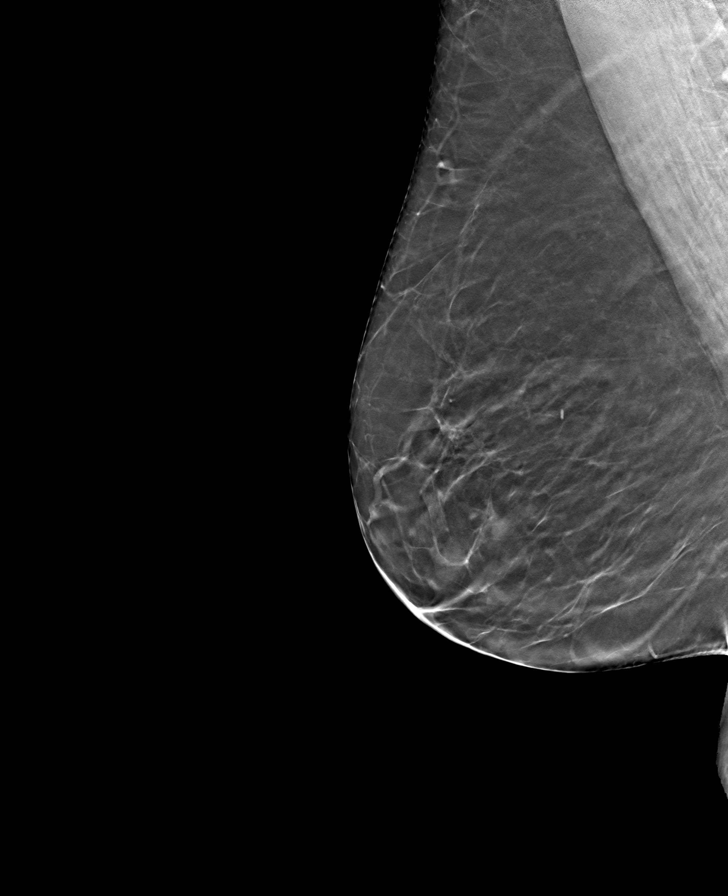

[L MLO tomo · tomo slice 33/64.0]
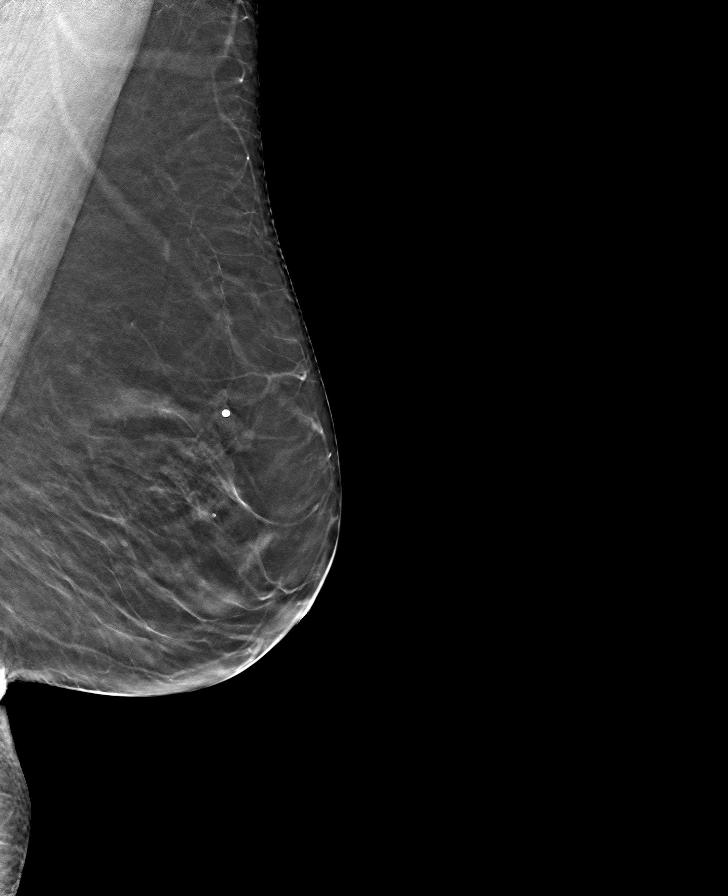

[R CC tomo · tomo slice 32/63.0]
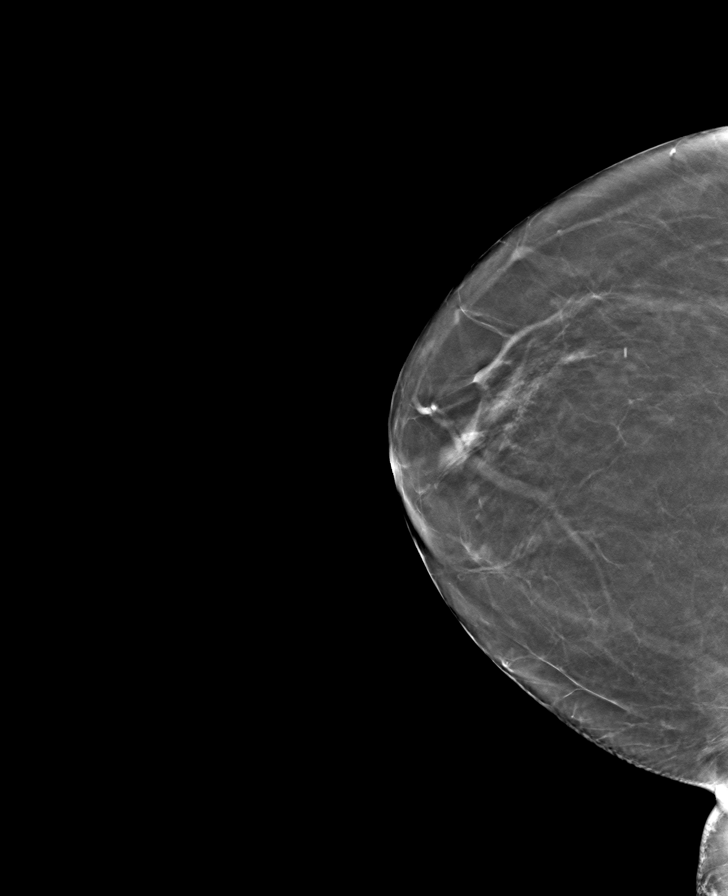

[L CC tomo · tomo slice 31/61.0]
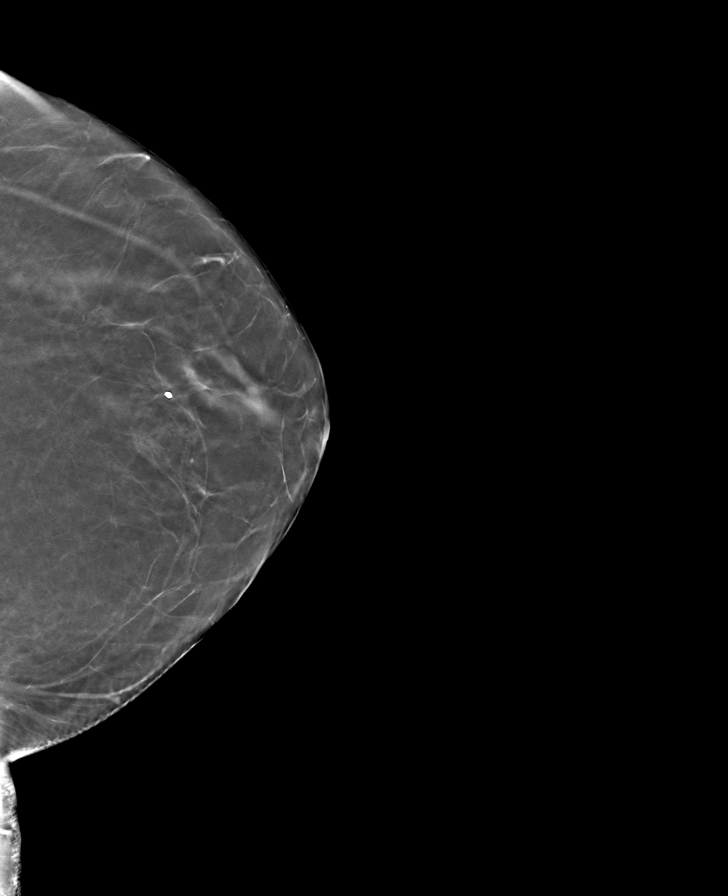

[8 of 24 positions shown; findings below may reference images not displayed]

ACR Breast Density Category b: There are scattered areas of
fibroglandular density.
FINDINGS: There are no findings suspicious for malignancy. Images were
processed with CAD.
IMPRESSION: No mammographic evidence of malignancy. A result letter of this
screening mammogram will be mailed directly to the patient.

RECOMMENDATION:
Screening mammogram in one year. (Code:[id number])

BI-RADS CATEGORY  1: Negative.

## 2021-07-31 DIAGNOSIS — N3281 Overactive bladder: Secondary | ICD-10-CM | POA: Insufficient documentation

## 2021-08-06 NOTE — Progress Notes (Unsigned)
08/07/2021 1:40 PM   Molly Brandt 04-Feb-1966 [id number]  Referring provider: [facility] [address] [place],  [place] [address]  No chief complaint on file.  Urological history: 1. Urinary incontinence -contributing factors of age, vaginal atrophy, COPD, OAB medication and obesity -PVR ***  HPI: Molly Brandt is a 56 y.o. female who presents today for three month follow up after a trial of oxybutynin XL 10 mg.    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   Chronic gastritis 04/11/2015   COPD (chronic obstructive pulmonary disease) (HCC)    Dyspnea    Ectopic pregnancy    GERD (gastroesophageal reflux disease)    Hepatitis    Hepatitis C   Pneumonia     Surgical History: Past Surgical History:  Procedure Laterality Date   COLONOSCOPY WITH PROPOFOL N/A 04/11/2015   Procedure: COLONOSCOPY WITH PROPOFOL;  Surgeon: Lollie Sails, MD;  Location[facility] ENDOSCOPY;  Service: Endoscopy;  Laterality: N/A;   DIAGNOSTIC LAPAROSCOPY     DIRECT LARYNGOSCOPY N/A 09/03/2017   Procedure: DIRECT LARYNGOSCOPY WITH EXCISION OF VOCAL CORD LESION;  Surgeon: Clyde Canterbury, MD;  Location: [facility] ORS;  Service: ENT;  Laterality: N/A;   ESOPHAGOGASTRODUODENOSCOPY (EGD) WITH PROPOFOL N/A 04/11/2015   Procedure: ESOPHAGOGASTRODUODENOSCOPY (EGD) WITH PROPOFOL;  Surgeon: Lollie Sails, MD;  Location: [facility] ENDOSCOPY;  Service: Endoscopy;  Laterality: N/A;   EXCISION OF TONGUE LESION Right 09/03/2017   Procedure: EXCISION OF TONGUE LESION;  Surgeon: Clyde Canterbury, MD;  Location: [facility] ORS;  Service: ENT;  Laterality: Right;   LAPAROSCOPIC UNILATERAL SALPINGECTOMY Left    MOUTH SURGERY      Home Medications:  Allergies as of 08/07/2021       Reactions   Naproxen    Stomach pain   Tramadol Nausea And Vomiting        Medication List        Accurate as of August 06, 2021  1:40 PM. If you  have any questions, ask your nurse or doctor.          albuterol 108 (90 Base) MCG/ACT inhaler Commonly known as: VENTOLIN HFA Inhale 2 puffs into the lungs every 6 (six) hours as needed for wheezing or shortness of breath.   albuterol (2.5 MG/3ML) 0.083% nebulizer solution Commonly known as: PROVENTIL Take 2.5 mg by nebulization every 6 (six) hours as needed for wheezing or shortness of breath.   amoxicillin 400 MG/5ML suspension Commonly known as: AMOXIL 2-1/4 teaspoons by mouth twice daily for 10 days   aspirin EC 325 MG tablet Take 325 mg by mouth daily as needed for moderate pain.   cetirizine 10 MG tablet Commonly known as: ZYRTEC Take 10 mg by mouth daily as needed for allergies.   cyclobenzaprine 10 MG tablet Commonly known as: FLEXERIL Take 10 mg by mouth 3 (three) times daily as needed for muscle spasms.   Fluticasone-Salmeterol 500-50 MCG/DOSE Aepb Commonly known as: ADVAIR Inhale 1 puff into the lungs 2 (two) times daily.   mometasone 50 MCG/ACT nasal spray Commonly known as: NASONEX Place 2 sprays into the nose daily.   omeprazole 40 MG capsule Commonly known as: PRILOSEC Take 40 mg by mouth 2 (two) times daily.   oxybutynin 10 MG 24 hr tablet Commonly known as: DITROPAN-XL Take 1 tablet (10 mg total) by mouth daily.   OXYGEN Inhale 2.5 L into the  lungs daily.   tiotropium 18 MCG inhalation capsule Commonly known as: SPIRIVA Place 18 mcg into inhaler and inhale every morning.        Allergies:  Allergies  Allergen Reactions   Naproxen     Stomach pain   Tramadol Nausea And Vomiting    Family History: Family History  Problem Relation Age of Onset   Cancer Father    Stroke Maternal Grandfather    Obesity Paternal Grandmother    Breast cancer Neg Hx     Social History:  reports that she has been smoking cigarettes. She has a 25.00 pack-year smoking history. She has never used smokeless tobacco. She reports that she does not drink  alcohol and does not use drugs.  ROS: Pertinent ROS in HPI  Physical Exam: There were no vitals taken for this visit.  Constitutional:  Well nourished. Alert and oriented, No acute distress. HEENT: [place] AT, moist mucus membranes.  Trachea midline, no masses. Cardiovascular: No clubbing, cyanosis, or edema. Respiratory: Normal respiratory effort, no increased work of breathing. GI: Abdomen is soft, non tender, non distended, no abdominal masses. Liver and spleen not palpable.  No hernias appreciated.  Stool sample for occult testing is not indicated.   GU: No CVA tenderness.  No bladder fullness or masses.  *** external genitalia, *** pubic hair distribution, no lesions.  Normal urethral meatus, no lesions, no prolapse, no discharge.   No urethral masses, tenderness and/or tenderness. No bladder fullness, tenderness or masses. *** vagina mucosa, *** estrogen effect, no discharge, no lesions, *** pelvic support, *** cystocele and *** rectocele noted.  No cervical motion tenderness.  Uterus is freely mobile and non-fixed.  No adnexal/parametria masses or tenderness noted.  Anus and perineum are without rashes or lesions.   ***  Skin: No rashes, bruises or suspicious lesions. Lymph: No cervical or inguinal adenopathy. Neurologic: Grossly intact, no focal deficits, moving all 4 extremities. Psychiatric: Normal mood and affect.    Laboratory Data: N/A   Pertinent Imaging: ***   Assessment & Plan:  ***  1. Urinary incontinence -  No follow-ups on file.  These notes generated with voice recognition software. I apologize for typographical errors.  Zara Council, PA-C  [facility] [address]  [place] [employer], [place] [address] [phone]

## 2021-08-07 ENCOUNTER — Ambulatory Visit: Payer: Medicaid Other | Admitting: Urology

## 2021-08-07 ENCOUNTER — Encounter: Payer: Self-pay | Admitting: Urology

## 2021-08-07 DIAGNOSIS — R32 Unspecified urinary incontinence: Secondary | ICD-10-CM

## 2023-04-24 DIAGNOSIS — F419 Anxiety disorder, unspecified: Secondary | ICD-10-CM | POA: Insufficient documentation

## 2023-07-15 ENCOUNTER — Ambulatory Visit: Payer: Medicaid Other | Admitting: Family Medicine

## 2023-07-15 ENCOUNTER — Encounter: Payer: Self-pay | Admitting: Family Medicine

## 2023-07-15 DIAGNOSIS — Z9229 Personal history of other drug therapy: Secondary | ICD-10-CM | POA: Insufficient documentation

## 2023-07-15 DIAGNOSIS — Z5901 Sheltered homelessness: Secondary | ICD-10-CM | POA: Insufficient documentation

## 2023-07-15 DIAGNOSIS — Z113 Encounter for screening for infections with a predominantly sexual mode of transmission: Secondary | ICD-10-CM

## 2023-07-15 DIAGNOSIS — K739 Chronic hepatitis, unspecified: Secondary | ICD-10-CM

## 2023-07-15 LAB — WET PREP FOR TRICH, YEAST, CLUE
Trichomonas Exam: POSITIVE — AB
Yeast Exam: NEGATIVE

## 2023-07-15 LAB — HM HIV SCREENING LAB: HM HIV Screening: NEGATIVE

## 2023-07-15 LAB — HEPATITIS B SURFACE ANTIGEN
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
## 2023-07-15 NOTE — Patient Instructions (Signed)
 STI screening - Today we obtained a vaginal swab to screen for gonorrhea, chlamydia, and trichomonas - We also obtained a blood sample to screen for HIV and syphilis - If the results are normal, I will send you a letter or MyChart message. If the results are abnormal, I will give you a call.    Estimated time frame for results collected at the [facility] Department: Same day Trichomonas Yeast BV (bacterial vaginosis)  Within 2 weeks Gonorrhea Chlamydia  Within 3-4 weeks HIV Syphilis Hepatitis B Hepatitis C    Smoking cessation resources Quit Smoking Hotline:  800-QUIT-NOW (409-811-9147)

## 2023-07-15 NOTE — Assessment & Plan Note (Signed)
Reports this is treated and managed by [facility] primary care in [place]. No Hep C screening indicated today.

## 2023-07-15 NOTE — Assessment & Plan Note (Addendum)
Patient reports staying in a motel and in her car while trying to save up money for an apartment down payment. Resources provided.

## 2023-07-15 NOTE — Progress Notes (Signed)
 Pt is here for STD testing.  Wet mount results reviewed. The patient was dispensed Metronidazole 500 mg #14 today. I provided counseling today regarding the medication. We discussed the medication, the side effects and when to call clinic. Patient given the opportunity to ask questions. Questions answered.  Condoms given.  Berdie Ogren, RN

## 2023-07-15 NOTE — Assessment & Plan Note (Signed)
Hep B non-immune per June 2024 blood work. If today's Hep B labs show no active infection and non-immunity, would need Hep B vaccination.

## 2023-07-20 LAB — GONOCOCCUS CULTURE

## 2023-07-25 NOTE — Progress Notes (Signed)
Reviewing labs on pt from 07/15/23 positive Hep B total core antibody reactive. Had previous lab work drawn 11/28/22 with same result. Pt had been referred to GI/ID provider pt refused. Pt needs f/u  with provider for additional testing and also has received 2 hep b shots. Has no completed the series.

## 2023-07-28 ENCOUNTER — Telehealth: Payer: Self-pay

## 2023-07-28 NOTE — Telephone Encounter (Signed)
Attempted to call pt regarding Hep B test results from 07/15/23. LMTRC.

## 2023-07-29 ENCOUNTER — Telehealth: Payer: Self-pay

## 2023-07-29 NOTE — Telephone Encounter (Signed)
Attempt #2 to contact regarding hep b test results. No answer. LMTRC.

## 2023-08-01 ENCOUNTER — Telehealth: Payer: Self-pay

## 2023-08-01 NOTE — Telephone Encounter (Signed)
Attempt #3 to contact regarding test results. Awaiting response.

## 2023-10-31 ENCOUNTER — Encounter: Payer: Self-pay | Admitting: Physician Assistant

## 2023-10-31 ENCOUNTER — Ambulatory Visit (INDEPENDENT_AMBULATORY_CARE_PROVIDER_SITE_OTHER): Admitting: Physician Assistant

## 2023-10-31 VITALS — BP 162/108 | HR 78 | Temp 98.3°F | Ht 63.0 in | Wt 123.0 lb

## 2023-10-31 DIAGNOSIS — Z59819 Housing instability, housed unspecified: Secondary | ICD-10-CM | POA: Insufficient documentation

## 2023-10-31 DIAGNOSIS — F5105 Insomnia due to other mental disorder: Secondary | ICD-10-CM

## 2023-10-31 DIAGNOSIS — M545 Low back pain, unspecified: Secondary | ICD-10-CM

## 2023-10-31 DIAGNOSIS — I10 Essential (primary) hypertension: Secondary | ICD-10-CM | POA: Diagnosis not present

## 2023-10-31 DIAGNOSIS — F4321 Adjustment disorder with depressed mood: Secondary | ICD-10-CM | POA: Insufficient documentation

## 2023-10-31 DIAGNOSIS — F332 Major depressive disorder, recurrent severe without psychotic features: Secondary | ICD-10-CM | POA: Insufficient documentation

## 2023-10-31 DIAGNOSIS — K8020 Calculus of gallbladder without cholecystitis without obstruction: Secondary | ICD-10-CM | POA: Insufficient documentation

## 2023-10-31 MED ORDER — QUETIAPINE FUMARATE 100 MG PO TABS: 200.0000 mg | ORAL_TABLET | Freq: Every day | ORAL | Status: AC

## 2023-10-31 MED ORDER — CYCLOBENZAPRINE HCL 10 MG PO TABS: 10.0000 mg | ORAL_TABLET | Freq: Three times a day (TID) | ORAL | 1 refills | Status: AC | PRN

## 2023-10-31 NOTE — Patient Instructions (Signed)
-  It was a pleasure to see you today! Please review your visit summary for helpful information -I would encourage you to follow your care via MyChart where you can access lab results, notes, messages, and more -If you feel that we did a nice job today, please complete your after-visit survey and leave [place] a Google review! Your CMA today was Mariann Barter and your provider was Alvester Morin, PA-C, DMSc -Please return for follow-up in about 3 weeks

## 2023-10-31 NOTE — Assessment & Plan Note (Signed)
 Referring to case management who will hopefully be able to assist with finances, housing, food insecurity.

## 2023-10-31 NOTE — Assessment & Plan Note (Signed)
 Refill cyclobenzaprine as below
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
## 2023-10-31 NOTE — Assessment & Plan Note (Addendum)
 Continue medications as prescribed, patient reminded that it will take some time for paroxetine to begin to work again, as she has only just restarted.  She verbalizes understanding.  Patient was glad that I was able to complete a letter for emotional support animal.  Hopefully this will allow her to house her dog and improve her acutely exacerbated anxiety and depression.

## 2023-10-31 NOTE — Assessment & Plan Note (Signed)
 Encouraged good compliance with losartan, reassess at short interval follow-up in 3 weeks

## 2023-10-31 NOTE — Assessment & Plan Note (Signed)
 Patient given the okay to take 2 tablets of Seroquel nightly, no prescription sent today, she still has some remaining.

## 2023-11-04 ENCOUNTER — Telehealth: Payer: Self-pay

## 2023-11-04 NOTE — Progress Notes (Signed)
 Complex Care Management Note Care Guide Note  11/04/2023 Name: Molly Brandt MRN: [id number] DOB: [birth year]   Complex Care Management Outreach Attempts: An unsuccessful telephone outreach was attempted today to offer the patient information about available complex care management services.  Follow Up Plan:  Additional outreach attempts will be made to offer the patient complex care management information and services.   Encounter Outcome:  No Answer  Lenton Rail , RMA       [facility], [facility] Guide  Direct Dial: [phone]  Website: [email or web address]

## 2023-11-14 NOTE — Progress Notes (Signed)
 Complex Care Management Note Care Guide Note  11/14/2023 Name: Rondell Frick MRN: [id number] DOB: 01-05-1966   Complex Care Management Outreach Attempts: An unsuccessful telephone outreach was attempted today to offer the patient information about available complex care management services.  Follow Up Plan:  Additional outreach attempts will be made to offer the patient complex care management information and services.   Encounter Outcome:  No Answer  Lenton Rail , RMA     [place]  [facility], [facility] Guide  Direct Dial: [phone]  Website: [email or web address]

## 2023-11-18 NOTE — Progress Notes (Signed)
 Complex Care Management Note Care Guide Note  11/18/2023 Name: Molly Brandt MRN: [id number] DOB: [birth year]   Complex Care Management Outreach Attempts: A third unsuccessful outreach was attempted today to offer the patient with information about available complex care management services.  Follow Up Plan:  No further outreach attempts will be made at this time. We have been unable to contact the patient to offer or enroll patient in complex care management services.  Encounter Outcome:  No Answer  Lenton Rail , RMA     [place]  [facility], [facility] Guide  Direct Dial: [phone]  Website: [email or web address]

## 2023-11-21 ENCOUNTER — Encounter: Payer: Self-pay | Admitting: Physician Assistant

## 2023-11-21 ENCOUNTER — Ambulatory Visit: Admitting: Physician Assistant
